# Patient Record
Sex: Male | Born: 1980 | Race: Black or African American | Hispanic: No | Marital: Single | Smoking: Current some day smoker
Health system: Southern US, Community
[De-identification: ages and names within clinical notes are randomized; demographics above are authoritative.]

## PROBLEM LIST (undated history)

## (undated) ENCOUNTER — Emergency Department (HOSPITAL_BASED_OUTPATIENT_CLINIC_OR_DEPARTMENT_OTHER): Payer: 59 | Source: Home / Self Care

## (undated) DIAGNOSIS — I10 Essential (primary) hypertension: Secondary | ICD-10-CM

## (undated) DIAGNOSIS — G8929 Other chronic pain: Secondary | ICD-10-CM

## (undated) DIAGNOSIS — M549 Dorsalgia, unspecified: Secondary | ICD-10-CM

## (undated) HISTORY — PX: CHOLECYSTECTOMY: SHX55

---

## 2004-09-24 ENCOUNTER — Emergency Department (HOSPITAL_COMMUNITY): Admission: EM | Admit: 2004-09-24 | Discharge: 2004-09-24 | Payer: Self-pay | Admitting: Emergency Medicine

## 2005-12-21 ENCOUNTER — Emergency Department (HOSPITAL_COMMUNITY): Admission: EM | Admit: 2005-12-21 | Discharge: 2005-12-21 | Payer: Self-pay | Admitting: Emergency Medicine

## 2005-12-23 ENCOUNTER — Emergency Department (HOSPITAL_COMMUNITY): Admission: EM | Admit: 2005-12-23 | Discharge: 2005-12-23 | Payer: Self-pay | Admitting: Emergency Medicine

## 2005-12-25 ENCOUNTER — Emergency Department (HOSPITAL_COMMUNITY): Admission: EM | Admit: 2005-12-25 | Discharge: 2005-12-25 | Payer: Self-pay | Admitting: Emergency Medicine

## 2006-04-01 ENCOUNTER — Emergency Department (HOSPITAL_COMMUNITY): Admission: EM | Admit: 2006-04-01 | Discharge: 2006-04-01 | Payer: Self-pay | Admitting: Emergency Medicine

## 2006-10-09 ENCOUNTER — Emergency Department (HOSPITAL_COMMUNITY): Admission: EM | Admit: 2006-10-09 | Discharge: 2006-10-09 | Payer: Self-pay | Admitting: Emergency Medicine

## 2008-05-05 ENCOUNTER — Emergency Department (HOSPITAL_BASED_OUTPATIENT_CLINIC_OR_DEPARTMENT_OTHER): Admission: EM | Admit: 2008-05-05 | Discharge: 2008-05-05 | Payer: Self-pay | Admitting: Emergency Medicine

## 2008-05-25 ENCOUNTER — Ambulatory Visit: Payer: Self-pay | Admitting: Diagnostic Radiology

## 2008-05-25 ENCOUNTER — Emergency Department (HOSPITAL_BASED_OUTPATIENT_CLINIC_OR_DEPARTMENT_OTHER): Admission: EM | Admit: 2008-05-25 | Discharge: 2008-05-25 | Payer: Self-pay | Admitting: Emergency Medicine

## 2009-08-03 ENCOUNTER — Emergency Department (HOSPITAL_BASED_OUTPATIENT_CLINIC_OR_DEPARTMENT_OTHER): Admission: EM | Admit: 2009-08-03 | Discharge: 2009-08-03 | Payer: Self-pay | Admitting: Emergency Medicine

## 2009-08-17 ENCOUNTER — Ambulatory Visit: Payer: Self-pay | Admitting: Internal Medicine

## 2009-08-17 DIAGNOSIS — I10 Essential (primary) hypertension: Secondary | ICD-10-CM | POA: Insufficient documentation

## 2009-08-17 DIAGNOSIS — M545 Low back pain: Secondary | ICD-10-CM

## 2009-08-21 ENCOUNTER — Ambulatory Visit: Payer: Self-pay | Admitting: Internal Medicine

## 2009-08-21 LAB — CONVERTED CEMR LAB
Chloride: 107 meq/L (ref 96–112)
HDL: 24 mg/dL — ABNORMAL LOW (ref >39)
LDL Cholesterol: 68 mg/dL (ref 0–99)
Potassium: 3.5 meq/L (ref 3.5–5.3)
Triglycerides: 54 mg/dL (ref <150)
VLDL: 11 mg/dL (ref 0–40)

## 2009-08-22 ENCOUNTER — Encounter: Payer: Self-pay | Admitting: Internal Medicine

## 2009-11-17 ENCOUNTER — Ambulatory Visit: Payer: Self-pay | Admitting: Internal Medicine

## 2009-11-17 DIAGNOSIS — K648 Other hemorrhoids: Secondary | ICD-10-CM

## 2009-11-26 ENCOUNTER — Ambulatory Visit: Payer: Self-pay | Admitting: Internal Medicine

## 2010-06-03 ENCOUNTER — Emergency Department (HOSPITAL_BASED_OUTPATIENT_CLINIC_OR_DEPARTMENT_OTHER)
Admission: EM | Admit: 2010-06-03 | Discharge: 2010-06-03 | Payer: Self-pay | Source: Home / Self Care | Admitting: Emergency Medicine

## 2010-07-06 NOTE — Letter (Signed)
Summary: Work Dietitian at [employer]. [address]   [place], [place] [address]   Phone: [phone]  Fax: [phone]      Today's Date: August 17, 2009    Name of Patient: Derrick Holden  The above named patient had a medical visit today .Please take this into consideration when reviewing the time away from work.  Special Instructions:  Arly.Keller ] None  [  ] To be off the remainder of today, returning to the normal work / school schedule tomorrow.  [  ] To be off until the next scheduled appointment on ______________________.  [  ] Other ________________________________________________________________ ________________________________________________________________________     Sincerely yours,   Glendell Docker CMA Dr Thomos Lemons

## 2010-07-06 NOTE — Letter (Signed)
   [place] at [facility] [address] [place], [place]  [address]  [place] Phone: [phone]      August 24, 2009   KIE CALVIN [address] [place], [place] [address]  RE:  LAB RESULTS  Dear  Mr. KELLEHER,  The following is an interpretation of your most recent lab tests.  Please take note of any instructions provided or changes to medications that have resulted from your lab work.  ELECTROLYTES:  Good - no changes needed  LIPID PANEL:  Good - no changes needed Triglyceride: 54   Cholesterol: 103   LDL: 68   HDL: 24   Chol/HDL%:  4.3 Ratio  THYROID STUDIES:  Thyroid studies normal TSH: 2.081       Sincerely Yours,    Dr. Thomos Lemons

## 2010-07-06 NOTE — Assessment & Plan Note (Signed)
Summary: 3 month follow up/mhf--Rm 3   Vital Signs:  Patient profile:   30 year old male Height:      75 inches Weight:      285 pounds BMI:     35.75 Temp:     97.8 degrees F oral Pulse rate:   66 / minute Pulse rhythm:   regular Resp:     16 per minute BP sitting:   148 / 90  (right arm) Cuff size:   large  Vitals Entered By: Mervin Kung CMA (November 17, 2009 2:10 PM) CC: Room 3  3 month follow up. Is Patient Diabetic? No   Primary Care Provider:  Dondra Spry DO  CC:  Room 3  3 month follow up.Marland Kitchen  History of Present Illness:       This is a 30 year old male who presents with hemorrhoids.  The patient complains of blood in stool, blood streaked stool, and straining, but denies abdominal pain.  The symptoms are worse with straining at stool and constipation.    elevated bp - never treated.  no chest pain .  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   Head:  normocephalic and atraumatic.   Ears:  R ear normal and L ear normal.   Mouth:  pharynx pink and moist.   Neck:  No deformities, masses, or tenderness noted. Lungs:  normal respiratory effort and normal breath sounds.   Heart:  normal rate, regular rhythm, and no gallop.   Abdomen:  soft, non-tender, and no masses.   Rectal:  normal sphincter tone, no masses, and internal hemorrhoid(s).   Extremities:  trace left pedal edema and trace right pedal edema.   Neurologic:  cranial nerves II-XII intact and gait normal.   Psych:  normally interactive, good eye contact, not anxious appearing, and not depressed appearing.       Impression & Recommendations:  Problem # 1:  HYPERTENSION (ICD-401.9) start losartan.  encourage low salt diet   His updated medication list for this problem includes:    Losartan Potassium 50 Mg Tabs (Losartan potassium) ..... One by mouth once daily  BP today: 148/90 Prior BP: 130/80 (08/17/2009)  Labs Reviewed: K+: 3.5 (08/21/2009) Creat: : 1.12 (08/21/2009)   Chol: 103 (08/21/2009)   HDL: 24 (08/21/2009)   LDL: 68 (08/21/2009)   TG: 54 (08/21/2009)  Problem # 2:  HEMORRHOIDS, INTERNAL, WITH BLEEDING (ICD-455.2) use stool softener and anamantle.  if persistent symptoms, refer to surgery  Complete Medication List: 1)  Losartan Potassium 50 Mg Tabs (Losartan potassium) .... One by mouth once daily 2)  Docusate Sodium 100 Mg Caps (Docusate sodium) .... One by mouth two times a day  Patient Instructions: 1)  Please schedule a follow-up appointment in 1 month. 2)  BMP prior to visit, ICD-9: 401.9 3)  Please return for lab work one (1) week before your next appointment.  Prescriptions: DOCUSATE SODIUM 100 MG CAPS (DOCUSATE SODIUM) one by mouth two times a day  #60 x 0   Entered and Authorized by:   D. Thomos Lemons DO   Signed by:   D. Thomos Lemons  DO on 11/17/2009   Method used:   Electronically to        [facility] # [phone]* (retail)       [address]       [place], [place]  [address]       Ph: [phone]       Fax: [phone]   RxID:   [id number] LIDOCAINE-HYDROCORTISONE ACE 3-1 % KIT (LIDOCAINE-HYDROCORTISONE ACE) use one applicatorful two times a day  #1 week x 0   Entered and Authorized by:   D. Thomos Lemons DO   Signed by:   D. Thomos Lemons DO on 11/17/2009   Method used:   Electronically to        [facility] # [phone]* (retail)       [address]       [place], [place]  [address]       Ph: [phone]       Fax: [phone]   RxID:   [id number] LOSARTAN POTASSIUM 50 MG TABS (LOSARTAN POTASSIUM)  one by mouth once daily  #30 x 2   Entered and Authorized by:   D. Thomos Lemons DO   Signed by:   D. Thomos Lemons DO on 11/17/2009   Method used:   Electronically to        [facility] # [phone]* (retail)       [address] [place] [address]       [place], [place]  [address]       Ph: [phone]       Fax: [phone]   RxID:   [id number]   Current Allergies (reviewed today): No known allergies

## 2010-07-06 NOTE — Assessment & Plan Note (Signed)
Summary: NEW TO EST/MHF   Vital Signs:  Patient profile:   30 year old male Height:      75 inches Weight:      299.50 pounds BMI:     37.57 O2 Sat:      100 % Temp:     97.8 degrees F oral Pulse rate:   86 / minute Pulse rhythm:   regular Resp:     18 per minute BP sitting:   130 / 80  (right arm) Cuff size:   large  Vitals Entered By: Glendell Docker CMA (August 17, 2009 1:24 PM) CC: Rm 2- New Patient -Establish Care   Primary Care Provider:  Dondra Spry DO  CC:  Rm 2- New Patient -Establish Care.  History of Present Illness: 20 AA male to establish  He was seen in ER for low back pain 1-2 wks ago - noticed BP was high SBP 170/ 80 - mild headaches.  no chest pain .  no SOB  Hx of chronic low back pain.  after prolonged sitting - back feels stiff on and off x 3 yrs.    more on right side  slipped on ice but  caught himself from falling  3 yrsago no injury or trauma played football in college (Defensive end) describes as sharp no radiation no uninary complaints  no weakness  Preventive Screening-Counseling & Management  Alcohol-Tobacco     Alcohol drinks/day: 2-3 beers monthly     Smoking Status: never  Caffeine-Diet-Exercise     Caffeine use/day: 4 beverages daily     Does Patient Exercise: no  Allergies (verified): No Known Drug Allergies  Past History:  Past Medical History: Hypertension  Past Surgical History: Cholecystectomy  2009  Family History: Family History Diabetes 1st degree relative - father Family History Hypertension  Social History: 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  The patient denies chest pain, dyspnea on exertion, prolonged cough, abdominal pain, melena, hematochezia, severe indigestion/heartburn, and depression.     Physical Exam  General:  alert and overweight-appearing.   Head:  normocephalic and atraumatic.   Eyes:  pupils equal, pupils round, and pupils reactive to light.   Ears:  R ear normal and L ear normal.   Mouth:  Oral mucosa and oropharynx without lesions or exudates.  Teeth in good repair. Neck:  No deformities, masses, or tenderness noted.no carotid bruits.   Lungs:  normal respiratory effort, normal breath sounds, no crackles, and no wheezes.   Heart:  normal rate, regular rhythm, no murmur, and no gallop.   Abdomen:  soft, non-tender, normal bowel sounds, no masses, no hepatomegaly, and no splenomegaly.   Extremities:  trace left pedal edema and trace right pedal edema.   Neurologic:  cranial nerves II-XII intact and gait normal.   Psych:  normally interactive, good eye contact, not anxious appearing, and not depressed appearing.     Impression & Recommendations:  Problem # 1:  ELEVATED BLOOD PRESSURE WITHOUT DIAGNOSIS OF HYPERTENSION (ICD-796.2) Pt seen in ER for LBP.  SBP noted 160-170.  BP today is high normal.  Keep BP log at home  BP today: 130/80  Instructed in low sodium diet  and behavior modification.    Problem # 2:  BACK PAIN, LUMBAR, CHRONIC (ICD-724.2) Pt with  chronic back stiffness.  no sign or symptom of radiculopathy.  he has appt at [facility].   I suspect back pain from spondylosis.  use OTC ibuprofen as needed.  Pt counseled on wt loss.  Problem # 3:  FAMILY HISTORY DIABETES 1ST DEGREE RELATIVE (ICD-V18.0) Pt counceled on diet and exercise.  screen for DM II - A1c  Patient Instructions: 1)  Please schedule a follow-up appointment in 3 months. 2)  [email or web address] 3)  Limit your calories to 2400-2500 calories per day 4)  BMP prior to visit, ICD-9:  401.9 5)  Lipid Panel prior to visit, ICD-9: 401.9 6)  TSH prior to visit, ICD-9:  401.9 7)  HbgA1C prior to visit, ICD-9:   v18.0 8)  Limit your Sodium (Salt) to less than 2.5 grams a  day(slightly less than 1/2 a teaspoon) to prevent fluid retention, swelling, or worsening of symptoms.  Current Allergies (reviewed today): No known allergies /   Immunization History:  Influenza Immunization History:    Influenza:  historical (03/19/2009)

## 2011-02-22 ENCOUNTER — Encounter: Payer: Self-pay | Admitting: *Deleted

## 2011-02-22 ENCOUNTER — Emergency Department (HOSPITAL_BASED_OUTPATIENT_CLINIC_OR_DEPARTMENT_OTHER)
Admission: EM | Admit: 2011-02-22 | Discharge: 2011-02-22 | Disposition: A | Discharge status: Home / Self Care | Payer: Self-pay | Attending: Emergency Medicine | Admitting: Emergency Medicine

## 2011-02-22 DIAGNOSIS — I10 Essential (primary) hypertension: Secondary | ICD-10-CM

## 2011-02-22 DIAGNOSIS — R319 Hematuria, unspecified: Secondary | ICD-10-CM | POA: Insufficient documentation

## 2011-02-22 DIAGNOSIS — M549 Dorsalgia, unspecified: Secondary | ICD-10-CM | POA: Insufficient documentation

## 2011-02-22 LAB — URINALYSIS, ROUTINE W REFLEX MICROSCOPIC
Bilirubin Urine: NEGATIVE
Glucose, UA: NEGATIVE mg/dL
Ketones, ur: NEGATIVE mg/dL
Protein, ur: NEGATIVE mg/dL

## 2011-02-22 MED ORDER — OXYCODONE-ACETAMINOPHEN 5-325 MG PO TABS: 1.0000 | ORAL_TABLET | Freq: Four times a day (QID) | ORAL | Status: AC | PRN

## 2011-02-22 MED ORDER — ONDANSETRON 8 MG PO TBDP
8.0000 mg | ORAL_TABLET | Freq: Once | ORAL | Status: AC
  Administered 2011-02-22: 8 mg via ORAL
  Filled 2011-02-22: qty 1

## 2011-02-22 MED ORDER — METRONIDAZOLE 500 MG PO TABS
2000.0000 mg | ORAL_TABLET | Freq: Once | ORAL | Status: AC
  Administered 2011-02-22: 2000 mg via ORAL
  Filled 2011-02-22: qty 4

## 2011-02-22 NOTE — ED Notes (Signed)
Lower back pain x 2 weeks. Thinks he has an STD.

## 2011-02-22 NOTE — ED Provider Notes (Signed)
History     CSN: [id number] Arrival date & time: 02/22/2011  5:48 PM Pt seen at 1800  Chief Complaint  Patient presents with  . Back Pain     (Include location/radiation/quality/duration/timing/severity/associated sxs/prior treatment) Patient is a 30 y.o. male presenting with back pain. The history is provided by the patient.  Back Pain  This is a new problem. The current episode started more than 1 week ago. The problem occurs constantly. The problem has been gradually worsening. The pain is associated with falling. The pain is present in the lumbar spine. The pain is moderate. The symptoms are aggravated by twisting and certain positions. Pertinent negatives include no chest pain, no fever, no numbness, no abdominal pain, no bowel incontinence, no bladder incontinence, no dysuria, no leg pain, no tingling and no weakness.   Pt reports he slipped at work but he caught himself and did not fall directly on back Since he has had pain No dysuria No low abd pain No dysuria/penile discharge Reports his girlfriend told him she tested positive for trichomonas, he is requesting treatment/testing  History reviewed. No pertinent past medical history.   Past Surgical History  Procedure Date  . Cholecystectomy     History reviewed. No pertinent family history.  History  Substance Use Topics  . Smoking status: Current Some Day Smoker  . Smokeless tobacco: Not on file  . Alcohol Use: Yes      Review of Systems  Constitutional: Negative for fever.  Cardiovascular: Negative for chest pain.  Gastrointestinal: Negative for abdominal pain and bowel incontinence.  Genitourinary: Negative for bladder incontinence and dysuria.  Musculoskeletal: Positive for back pain.  Neurological: Negative for tingling, weakness and numbness.  All other systems reviewed and are negative.    Allergies  Review of patient's allergies indicates no known allergies.  Home Medications   Current  Outpatient Rx  Name Route Sig Dispense Refill  . ACETAMINOPHEN 500 MG PO CHEW Oral Chew 500 mg by mouth every 6 (six) hours as needed. pain     . NAPROXEN SODIUM 220 MG PO TABS Oral Take 220 mg by mouth 2 (two) times daily with a meal. pain     . OXYCODONE-ACETAMINOPHEN 5-325 MG PO TABS Oral Take 1 tablet by mouth every 6 (six) hours as needed for pain. 10 tablet 0    Physical Exam    BP 151/100  Pulse 87  Temp(Src) 98.5 F (36.9 C) (Oral)  Resp 20  SpO2 100%  Physical Exam  CONSTITUTIONAL: Well developed/well nourished HEAD AND FACE: Normocephalic/atraumatic EYES: EOMI/PERRL ENMT: Mucous membranes moist NECK: supple no meningeal signs SPINE:entire spine nontender, tender in right paraspinal region, no erythema/bruising noted CV: S1/S2 noted, no murmurs/rubs/gallops noted LUNGS: Lungs are clear to auscultation bilaterally, no apparent distress ABDOMEN: soft, nontender, no rebound or guarding GU:no cva tenderness Chaperone present - no penile disharge, normal external genitalia NEURO: Pt is awake/alert, moves all extremitiesx4 Gait normal No focal motor deficits noted in the LE EXTREMITIES: pulses normal, full ROM SKIN: warm, color normal PSYCH: no abnormalities of mood noted   ED Course  Procedures  Results for orders placed during the hospital encounter of 02/22/11  URINALYSIS, ROUTINE W REFLEX MICROSCOPIC      Component Value Range   Color, Urine YELLOW  YELLOW    Appearance CLEAR  CLEAR    Specific Gravity, Urine 1.028  1.005 - 1.030    pH 6.5  5.0 - 8.0    Glucose, UA NEGATIVE  NEGATIVE (mg/dL)  Hgb urine dipstick LARGE (*) NEGATIVE    Bilirubin Urine NEGATIVE  NEGATIVE    Ketones, ur NEGATIVE  NEGATIVE (mg/dL)   Protein, ur NEGATIVE  NEGATIVE (mg/dL)   Urobilinogen, UA 1.0  0.0 - 1.0 (mg/dL)   Nitrite NEGATIVE  NEGATIVE    Leukocytes, UA NEGATIVE  NEGATIVE   URINE MICROSCOPIC-ADD ON      Component Value Range   Squamous Epithelial / LPF RARE  RARE     RBC / HPF 11-20  <3 (RBC/hpf)   Bacteria, UA RARE  RARE      1. Back pain   2. HYPERTENSION   3. Hematuria      MDM Nursing notes reviewed and considered in documentation All labs/vitals reviewed and considered - hematuria, hypertension  Advised f/u with occupational health as back pain could be work related Also referred to urology for hematuria Also advised to f/u on HTN, he reports he is supposed to be on meds but stopped taking them Urine sent for culture        Joya Gaskins, MD 02/22/11 1925

## 2011-02-23 LAB — GC/CHLAMYDIA PROBE AMP, GENITAL
Chlamydia, DNA Probe: NEGATIVE
GC Probe Amp, Genital: NEGATIVE

## 2011-02-24 LAB — URINE CULTURE: Culture  Setup Time: 201209190628

## 2011-03-11 LAB — COMPREHENSIVE METABOLIC PANEL
Albumin: 4.6 g/dL (ref 3.5–5.2)
BUN: 9 mg/dL (ref 6–23)
Chloride: 104 mEq/L (ref 96–112)
Creatinine, Ser: 1 mg/dL (ref 0.4–1.5)
GFR calc non Af Amer: >60 mL/min (ref >60)
Total Bilirubin: 0.4 mg/dL (ref 0.3–1.2)

## 2011-03-11 LAB — DIFFERENTIAL
Basophils Absolute: 0 10*3/uL (ref 0.0–0.1)
Lymphocytes Relative: 7 % — ABNORMAL LOW (ref 12–46)
Monocytes Absolute: 0.4 10*3/uL (ref 0.1–1.0)
Neutro Abs: 9.5 10*3/uL — ABNORMAL HIGH (ref 1.7–7.7)
Neutrophils Relative %: 89 % — ABNORMAL HIGH (ref 43–77)

## 2011-03-11 LAB — CBC
HCT: 40.7 % (ref 39.0–52.0)
MCV: 87.3 fL (ref 78.0–100.0)
Platelets: 280 10*3/uL (ref 150–400)
RDW: 13.3 % (ref 11.5–15.5)
WBC: 10.6 10*3/uL — ABNORMAL HIGH (ref 4.0–10.5)

## 2011-03-11 LAB — LIPASE, BLOOD: Lipase: 107 U/L (ref 23–300)

## 2011-08-24 ENCOUNTER — Emergency Department (INDEPENDENT_AMBULATORY_CARE_PROVIDER_SITE_OTHER): Payer: Self-pay

## 2011-08-24 ENCOUNTER — Encounter (HOSPITAL_BASED_OUTPATIENT_CLINIC_OR_DEPARTMENT_OTHER): Payer: Self-pay | Admitting: Student

## 2011-08-24 ENCOUNTER — Emergency Department (HOSPITAL_BASED_OUTPATIENT_CLINIC_OR_DEPARTMENT_OTHER)
Admission: EM | Admit: 2011-08-24 | Discharge: 2011-08-24 | Disposition: A | Discharge status: Home / Self Care | Payer: Self-pay | Attending: Emergency Medicine | Admitting: Emergency Medicine

## 2011-08-24 DIAGNOSIS — IMO0002 Reserved for concepts with insufficient information to code with codable children: Secondary | ICD-10-CM | POA: Insufficient documentation

## 2011-08-24 DIAGNOSIS — M25519 Pain in unspecified shoulder: Secondary | ICD-10-CM | POA: Insufficient documentation

## 2011-08-24 DIAGNOSIS — I10 Essential (primary) hypertension: Secondary | ICD-10-CM | POA: Insufficient documentation

## 2011-08-24 DIAGNOSIS — S46919A Strain of unspecified muscle, fascia and tendon at shoulder and upper arm level, unspecified arm, initial encounter: Secondary | ICD-10-CM

## 2011-08-24 DIAGNOSIS — F17200 Nicotine dependence, unspecified, uncomplicated: Secondary | ICD-10-CM | POA: Insufficient documentation

## 2011-08-24 DIAGNOSIS — X500XXA Overexertion from strenuous movement or load, initial encounter: Secondary | ICD-10-CM | POA: Insufficient documentation

## 2011-08-24 MED ORDER — TRAMADOL HCL 50 MG PO TABS: 50.0000 mg | ORAL_TABLET | Freq: Four times a day (QID) | ORAL | Status: AC | PRN

## 2011-08-24 NOTE — ED Provider Notes (Signed)
History     CSN: [id number]  Arrival date & time 08/24/11  1709   First MD Initiated Contact with Patient 08/24/11 1741      Chief Complaint  Patient presents with  . Shoulder Pain    left    (Consider location/radiation/quality/duration/timing/severity/associated sxs/prior treatment) HPI Comments: Pt states that he woke up with the pain Sunday morning after heavy lifting on Saturday:pt works for [employer] and does a lot or repetitive motion and heavy lifting:pt states that it hurts to move the arm above the shoulder  Patient is a 31 y.o. male presenting with shoulder pain. The history is provided by the patient. No language interpreter was used.  Shoulder Pain This is a new problem. The current episode started in the past 7 days. The problem occurs constantly. The problem has been unchanged. Pertinent negatives include no nausea or weakness. The symptoms are aggravated by bending. He has tried nothing for the symptoms.    History reviewed. No pertinent past medical history.  Past Surgical History  Procedure Date  . Cholecystectomy     History reviewed. No pertinent family history.  History  Substance Use Topics  . Smoking status: Current Some Day Smoker  . Smokeless tobacco: Not on file  . Alcohol Use: Yes      Review of Systems  Gastrointestinal: Negative for nausea.  Neurological: Negative for weakness.  All other systems reviewed and are negative.    Allergies  Review of patient's allergies indicates no known allergies.  Home Medications  No current outpatient prescriptions on file.  BP 183/106  Pulse 88  Temp(Src) 98 F (36.7 C) (Oral)  Resp 20  Ht 6\' 3"  (1.905 m)  Wt 285 lb (129.275 kg)  BMI 35.62 kg/m2  SpO2 100%  Physical Exam  Nursing note and vitals reviewed. Constitutional: He is oriented to person, place, and time. He appears well-developed and well-nourished.  Cardiovascular: Normal rate and regular rhythm.   Pulmonary/Chest: Effort normal  and breath sounds normal.  Musculoskeletal:       Pt tender in the anterior left shoulder:pt able to posteriorly rotate:pt has full rom with lifting up but pain starts at shoulder height:pulses intact  Neurological: He is alert and oriented to person, place, and time.  Skin: Skin is warm and dry.  Psychiatric: He has a normal mood and affect.    ED Course  Procedures (including critical care time)  Labs Reviewed - No data to display Dg Shoulder Left  08/24/2011  *RADIOLOGY REPORT*  Clinical Data: Severe left shoulder pain, does heavy lifting at work  LEFT SHOULDER - 2+ VIEW  Comparison: None  Findings: AC joint alignment normal. Osseous mineralization normal. No acute fracture, dislocation, or bone destruction. Visualized left ribs intact.  IMPRESSION: No acute abnormalities.  Original Report Authenticated By: Lollie Marrow, M.D.     1. Hypertension   2. Shoulder strain       MDM  No acute injury noted:likely strain from repetitive motion:will treat symptomatically        Teressa Lower, NP 08/24/11 [phone]

## 2011-08-24 NOTE — ED Notes (Signed)
Pt in with c/o left shoulder pain and decreased movement s/p waking up Sunday morning after working Saturday lifting heavy boxes.

## 2011-08-24 NOTE — Discharge Instructions (Signed)
Joint Sprain A sprain is a tear or stretch in the ligaments that hold a joint together. Severe sprains may need as long as 3-6 weeks of immobilization and/or exercises to heal completely. Sprained joints should be rested and protected. If not, they can become unstable and prone to re-injury. Proper treatment can reduce your pain, shorten the period of disability, and reduce the risk of repeated injuries. TREATMENT   Rest and elevate the injured joint to reduce pain and swelling.   Apply ice packs to the injury for 20-30 minutes every 2-3 hours for the next 2-3 days.   Keep the injury wrapped in a compression bandage or splint as long as the joint is painful or as instructed by your caregiver.   Do not use the injured joint until it is completely healed to prevent re-injury and chronic instability. Follow the instructions of your caregiver.   Long-term sprain management may require exercises and/or treatment by a physical therapist. Taping or special braces may help stabilize the joint until it is completely better.  SEEK MEDICAL CARE IF:   You develop increased pain or swelling of the joint.   You develop increasing redness and warmth of the joint.   You develop a fever.   It becomes stiff.   Your hand or foot gets cold or numb.  Document Released: 06/30/2004 Document Revised: 05/12/2011 Document Reviewed: 06/09/2008 [facility] Patient Information 2012 [place], [place].Muscle Strain A muscle strain, or pulled muscle, occurs when a muscle is over-stretched. A small number of muscle fibers may also be torn. This is especially common in athletes. This happens when a sudden violent force placed on a muscle pushes it past its capacity. Usually, recovery from a pulled muscle takes 1 to 2 weeks. But complete healing will take 5 to 6 weeks. There are millions of muscle fibers. Following injury, your body will usually return to normal quickly. HOME CARE INSTRUCTIONS   While awake, apply ice to the  sore muscle for 15 to 20 minutes each hour for the first 2 days. Put ice in a plastic bag and place a towel between the bag of ice and your skin.   Do not use the pulled muscle for several days. Do not use the muscle if you have pain.   You may wrap the injured area with an elastic bandage for comfort. Be careful not to bind it too tightly. This may interfere with blood circulation.   Only take over-the-counter or prescription medicines for pain, discomfort, or fever as directed by your caregiver. Do not use aspirin as this will increase bleeding (bruising) at injury site.   Warming up before exercise helps prevent muscle strains.  SEEK MEDICAL CARE IF:  There is increased pain or swelling in the affected area. MAKE SURE YOU:   Understand these instructions.   Will watch your condition.   Will get help right away if you are not doing well or get worse.  Document Released: 05/23/2005 Document Revised: 05/12/2011 Document Reviewed: 12/20/2006 [facility] Patient Information 2012 [employer], [place].

## 2011-08-25 NOTE — ED Provider Notes (Signed)
Medical screening examination/treatment/procedure(s) were performed by non-physician practitioner and as supervising physician I was immediately available for consultation/collaboration.   Gwyneth Sprout, MD 08/25/11 [phone]

## 2012-04-16 ENCOUNTER — Encounter (HOSPITAL_BASED_OUTPATIENT_CLINIC_OR_DEPARTMENT_OTHER): Payer: Self-pay | Admitting: Emergency Medicine

## 2012-04-16 DIAGNOSIS — R05 Cough: Secondary | ICD-10-CM | POA: Insufficient documentation

## 2012-04-16 DIAGNOSIS — IMO0002 Reserved for concepts with insufficient information to code with codable children: Secondary | ICD-10-CM | POA: Insufficient documentation

## 2012-04-16 DIAGNOSIS — R6889 Other general symptoms and signs: Secondary | ICD-10-CM | POA: Insufficient documentation

## 2012-04-16 DIAGNOSIS — F17200 Nicotine dependence, unspecified, uncomplicated: Secondary | ICD-10-CM | POA: Insufficient documentation

## 2012-04-16 DIAGNOSIS — Y929 Unspecified place or not applicable: Secondary | ICD-10-CM | POA: Insufficient documentation

## 2012-04-16 DIAGNOSIS — R059 Cough, unspecified: Secondary | ICD-10-CM | POA: Insufficient documentation

## 2012-04-16 DIAGNOSIS — R1310 Dysphagia, unspecified: Secondary | ICD-10-CM | POA: Insufficient documentation

## 2012-04-16 DIAGNOSIS — Y9389 Activity, other specified: Secondary | ICD-10-CM | POA: Insufficient documentation

## 2012-04-16 DIAGNOSIS — R070 Pain in throat: Secondary | ICD-10-CM | POA: Insufficient documentation

## 2012-04-16 NOTE — ED Notes (Signed)
Pt c/o right sided throat discomfort. Pt concerned he may have a fish bone stuck in throat

## 2012-04-16 NOTE — ED Notes (Signed)
Pt able to swallow and handle secretions. Pt states pain in throat worse when laying down.

## 2012-04-17 ENCOUNTER — Emergency Department (HOSPITAL_BASED_OUTPATIENT_CLINIC_OR_DEPARTMENT_OTHER): Payer: Self-pay

## 2012-04-17 ENCOUNTER — Emergency Department (HOSPITAL_BASED_OUTPATIENT_CLINIC_OR_DEPARTMENT_OTHER)
Admission: EM | Admit: 2012-04-17 | Discharge: 2012-04-17 | Disposition: A | Discharge status: Home / Self Care | Payer: Self-pay | Attending: Emergency Medicine | Admitting: Emergency Medicine

## 2012-04-17 DIAGNOSIS — R070 Pain in throat: Secondary | ICD-10-CM

## 2012-04-17 DIAGNOSIS — K228 Other specified diseases of esophagus: Secondary | ICD-10-CM

## 2012-04-17 NOTE — ED Provider Notes (Signed)
History     CSN: [id number]  Arrival date & time 04/16/12  2304   First MD Initiated Contact with Patient 04/17/12 0115      Chief Complaint  Patient presents with  . Foreign Body    (Consider location/radiation/quality/duration/timing/severity/associated sxs/prior treatment) HPI Comments: Derrick Holden presents ambulatory for evaluation of a possible esophageal foreign body.  He states he returned home after eating dinner (boneless tilapia).  He felt a scratchy sensation in his throat as if there was something stuck.  He reports that he did some gagging but the sensation persisted.  He drank water and then ate some bread but continues to have that feeling.  He denies fever, SOB, nausea, vomiting, diarrhea, or throat swelling.  He has a never had chest or esophageal surgery or radiation.  Patient is a 31 y.o. male presenting with foreign body. The history is provided by the patient. No language interpreter was used.  Foreign Body  The current episode started 3 to 5 hours ago. The foreign body is suspected to be swallowed. The foreign body is food. Associated symptoms include sore throat, trouble swallowing and cough. Pertinent negatives include no chest pain, no fever, no vomiting, no congestion, no drainage, no drooling, no nosebleeds, no choking and no difficulty breathing. He has been behaving normally. His past medical history does not include prior foreign body removal, esophageal disease or pica. There were no sick contacts. He has received no recent medical care.    History reviewed. No pertinent past medical history.  Past Surgical History  Procedure Date  . Cholecystectomy     No family history on file.  History  Substance Use Topics  . Smoking status: Current Some Day Smoker  . Smokeless tobacco: Not on file  . Alcohol Use: Yes      Review of Systems  Constitutional: Negative for fever.  HENT: Positive for sore throat and trouble swallowing. Negative for  nosebleeds, congestion and drooling.   Respiratory: Positive for cough. Negative for choking.   Cardiovascular: Negative for chest pain.  Gastrointestinal: Negative for vomiting.  All other systems reviewed and are negative.    Allergies  Review of patient's allergies indicates no known allergies.  Home Medications  No current outpatient prescriptions on file.  BP 162/111  Pulse 91  Temp 98 F (36.7 C) (Oral)  Resp 18  Ht 6\' 4"  (1.93 m)  Wt 304 lb (137.893 kg)  BMI 37.00 kg/m2  SpO2 100%  Physical Exam  Nursing note and vitals reviewed. Constitutional: He is oriented to person, place, and time. He appears well-developed and well-nourished.  Non-toxic appearance. He does not have a sickly appearance. He does not appear ill. No distress.  HENT:  Head: Normocephalic and atraumatic. Head is without raccoon's eyes, without Battle's sign, without right periorbital erythema and without left periorbital erythema. No trismus in the jaw.  Right Ear: External ear normal.  Left Ear: External ear normal.  Nose: Nose normal.  Mouth/Throat: Uvula is midline, oropharynx is clear and moist and mucous membranes are normal. Mucous membranes are not pale, not dry and not cyanotic. He does not have dentures. No oral lesions. Normal dentition. No dental abscesses, uvula swelling, lacerations or dental caries. No oropharyngeal exudate, posterior oropharyngeal edema, posterior oropharyngeal erythema or tonsillar abscesses.  Eyes: Conjunctivae normal are normal. Pupils are equal, round, and reactive to light. Right eye exhibits no discharge. Left eye exhibits no discharge. No scleral icterus.  Neck: Normal range of motion and full passive range  of motion without pain. Neck supple. No JVD present. No spinous process tenderness and no muscular tenderness present. Carotid bruit is not present. No rigidity. No tracheal deviation and no edema present. No mass and no thyromegaly present.  Cardiovascular: Normal  rate, regular rhythm, normal heart sounds and intact distal pulses.  Exam reveals no gallop and no friction rub.   No murmur heard. Pulmonary/Chest: Effort normal and breath sounds normal. No stridor. No respiratory distress. He has no wheezes. He has no rales. He exhibits no tenderness.  Abdominal: Soft. Bowel sounds are normal. He exhibits no distension and no mass. There is no tenderness. There is no rebound and no guarding.  Musculoskeletal: He exhibits no edema and no tenderness.  Lymphadenopathy:    He has no cervical adenopathy.  Neurological: He is alert and oriented to person, place, and time.  Skin: Skin is warm and dry. No rash noted. He is not diaphoretic. No erythema. No pallor.  Psychiatric: He has a normal mood and affect. His behavior is normal.    ED Course  Procedures (including critical care time)  Labs Reviewed - No data to display No results found.   No diagnosis found.    MDM  Pt presents for evaluation of a sensation of something stuck in his throat.  He ate fish for dinner.  He appears comfortable, NAD.  He has no clinical evidence of airway compromise and has tolerated po fluids and some bread since the onset of s/s.  Will obtain soft tissue xrays of the neck looking for a foreign body.  If necessary will move to [facility] for evaluation by ENT or GI (endoscopy).  0400.  Pt appears well, NAD.  He reports some improvement in the FB sensation without any particular intervention.  The soft tissue x-ray dose not demonstrate any foreign body or acute pathology.  He has no evidence of airway compromise.  At this time he will remain NPO and be discharged home.  Will arrange follow-up with ENT (or GI) this morning as an office visit.  If this isn't possible, he will be instructed to report to the [facility] ER for a repeat evaluation with consultation for evaluation and endoscopy.  I discussed at length indications for immediate return to the ER.  Prior to him leaving I confirmed his  contact number.      Derrick Chad, MD 04/17/12 [phone]

## 2012-08-03 ENCOUNTER — Emergency Department (HOSPITAL_COMMUNITY)
Admission: EM | Admit: 2012-08-03 | Discharge: 2012-08-03 | Disposition: A | Discharge status: Home / Self Care | Payer: Self-pay | Attending: Emergency Medicine | Admitting: Emergency Medicine

## 2012-08-03 ENCOUNTER — Encounter (HOSPITAL_COMMUNITY): Payer: Self-pay | Admitting: Emergency Medicine

## 2012-08-03 DIAGNOSIS — R1013 Epigastric pain: Secondary | ICD-10-CM | POA: Insufficient documentation

## 2012-08-03 DIAGNOSIS — R110 Nausea: Secondary | ICD-10-CM | POA: Insufficient documentation

## 2012-08-03 DIAGNOSIS — Z79899 Other long term (current) drug therapy: Secondary | ICD-10-CM | POA: Insufficient documentation

## 2012-08-03 DIAGNOSIS — F17200 Nicotine dependence, unspecified, uncomplicated: Secondary | ICD-10-CM | POA: Insufficient documentation

## 2012-08-03 LAB — COMPREHENSIVE METABOLIC PANEL
ALT: 38 U/L (ref 0–53)
AST: 57 U/L — ABNORMAL HIGH (ref 0–37)
Alkaline Phosphatase: 117 U/L (ref 39–117)
CO2: 30 mEq/L (ref 19–32)
Chloride: 103 mEq/L (ref 96–112)
GFR calc Af Amer: >90 mL/min (ref >90)
GFR calc non Af Amer: >90 mL/min (ref >90)
Glucose, Bld: 89 mg/dL (ref 70–99)
Potassium: 3.5 mEq/L (ref 3.5–5.1)
Sodium: 142 mEq/L (ref 135–145)

## 2012-08-03 LAB — URINALYSIS, ROUTINE W REFLEX MICROSCOPIC
Bilirubin Urine: NEGATIVE
Glucose, UA: NEGATIVE mg/dL
Hgb urine dipstick: NEGATIVE
Protein, ur: NEGATIVE mg/dL
Urobilinogen, UA: 0.2 mg/dL (ref 0.0–1.0)

## 2012-08-03 LAB — CBC
Hemoglobin: 13.5 g/dL (ref 13.0–17.0)
RBC: 4.75 MIL/uL (ref 4.22–5.81)
WBC: 14.4 10*3/uL — ABNORMAL HIGH (ref 4.0–10.5)

## 2012-08-03 MED ORDER — RANITIDINE HCL 150 MG PO TABS: 150.0000 mg | ORAL_TABLET | Freq: Two times a day (BID) | ORAL | Status: DC

## 2012-08-03 MED ORDER — GI COCKTAIL ~~LOC~~
30.0000 mL | Freq: Once | ORAL | Status: AC
  Administered 2012-08-03: 30 mL via ORAL
  Filled 2012-08-03: qty 30

## 2012-08-03 MED ORDER — SODIUM CHLORIDE 0.9 % IV BOLUS (SEPSIS)
1000.0000 mL | Freq: Once | INTRAVENOUS | Status: AC
  Administered 2012-08-03: 1000 mL via INTRAVENOUS

## 2012-08-03 NOTE — ED Notes (Signed)
WUJ:[id number]<JY> Expected date:<BR> Expected time:<BR> Means of arrival:Ambulance<BR> Comments:<BR> [id number]

## 2012-08-03 NOTE — ED Notes (Signed)
Per EMS-pt c/o of upper abd pain that started ago. Pain 10/10. Also c/o of nausea no vomiting.

## 2012-08-03 NOTE — ED Provider Notes (Signed)
History     CSN: [id number]  Arrival date & time 08/03/12  1432   First MD Initiated Contact with Patient 08/03/12 1503      Chief Complaint  Patient presents with  . Abdominal Pain    (Consider location/radiation/quality/duration/timing/severity/associated sxs/prior treatment) HPI Pt presents with c/o epigastric pain.  Pt states pain began approx 1 hour prior to arrival.  It was described as feeling sharp and cramping.  No vomiting, no fever/chills.  He did have nausea.  Pain was 10/10 and constant.  No pain upon my evaluation.  He has had symptoms of reflux in the past and did eat fast food for late breakfast.  There are no other associated systemic symptoms, there are no other alleviating or modifying factors.   History reviewed. No pertinent past medical history.  Past Surgical History  Procedure Laterality Date  . Cholecystectomy      History reviewed. No pertinent family history.  History  Substance Use Topics  . Smoking status: Current Some Day Smoker  . Smokeless tobacco: Not on file  . Alcohol Use: Yes      Review of Systems ROS reviewed and all otherwise negative except for mentioned in HPI  Allergies  Review of patient's allergies indicates no known allergies.  Home Medications   Current Outpatient Rx  Name  Route  Sig  Dispense  Refill  . ranitidine (ZANTAC) 150 MG tablet   Oral   Take 1 tablet (150 mg total) by mouth 2 (two) times daily.   60 tablet   0     BP 159/85  Pulse 83  Temp(Src) 97.9 F (36.6 C) (Oral)  Resp 16  SpO2 98% Vitals reviewed Physical Exam Physical Examination: General appearance - alert, well appearing, and in no distress Mental status - alert, oriented to person, place, and time Eyes - no scleral icterus, no conjunctival injection Mouth - mucous membranes moist, pharynx normal without lesions Chest - clear to auscultation, no wheezes, rales or rhonchi, symmetric air entry Heart - normal rate, regular rhythm, normal  S1, S2, no murmurs, rubs, clicks or gallops Abdomen - soft, nontender, nondistended, no masses or organomegaly, nabs Extremities - peripheral pulses normal, no pedal edema, no clubbing or cyanosis Skin - normal coloration and turgor, no rashes  ED Course  Procedures (including critical care time)  Labs Reviewed  CBC - Abnormal; Notable for the following:    WBC 14.4 (*)    All other components within normal limits  COMPREHENSIVE METABOLIC PANEL - Abnormal; Notable for the following:    Total Protein 8.7 (*)    AST 57 (*)    All other components within normal limits  LIPASE, BLOOD  URINALYSIS, ROUTINE W REFLEX MICROSCOPIC   No results found.   1. Epigastric pain       MDM  Pt presenting with acute onset of epigastric pain which is resolved upon arrival to the ED.  No vomiting.  He is s/p cholecystectomy.  Doubt biliary colic, lipase is normal making pancreatitis less likely.  Suspect GERD/reflux.  Will start trial of zantac. Pt is tolerating po intake in the ED without difficulty.  No chest pain or SOB.  Discharged with strict return precautions.  Pt agreeable with plan.        Ethelda Chick, MD 08/03/12 [phone]

## 2012-08-03 NOTE — ED Notes (Signed)
Patient was given a ginger ale and tolerated it well.

## 2012-08-19 ENCOUNTER — Emergency Department (HOSPITAL_BASED_OUTPATIENT_CLINIC_OR_DEPARTMENT_OTHER)
Admission: EM | Admit: 2012-08-19 | Discharge: 2012-08-19 | Disposition: A | Discharge status: Home / Self Care | Payer: Self-pay | Attending: Emergency Medicine | Admitting: Emergency Medicine

## 2012-08-19 ENCOUNTER — Encounter (HOSPITAL_BASED_OUTPATIENT_CLINIC_OR_DEPARTMENT_OTHER): Payer: Self-pay | Admitting: *Deleted

## 2012-08-19 DIAGNOSIS — IMO0002 Reserved for concepts with insufficient information to code with codable children: Secondary | ICD-10-CM | POA: Insufficient documentation

## 2012-08-19 DIAGNOSIS — F17200 Nicotine dependence, unspecified, uncomplicated: Secondary | ICD-10-CM | POA: Insufficient documentation

## 2012-08-19 DIAGNOSIS — Y9389 Activity, other specified: Secondary | ICD-10-CM | POA: Insufficient documentation

## 2012-08-19 DIAGNOSIS — Y990 Civilian activity done for income or pay: Secondary | ICD-10-CM | POA: Insufficient documentation

## 2012-08-19 DIAGNOSIS — Y9289 Other specified places as the place of occurrence of the external cause: Secondary | ICD-10-CM | POA: Insufficient documentation

## 2012-08-19 DIAGNOSIS — X500XXA Overexertion from strenuous movement or load, initial encounter: Secondary | ICD-10-CM | POA: Insufficient documentation

## 2012-08-19 MED ORDER — IBUPROFEN 800 MG PO TABS: 800.0000 mg | ORAL_TABLET | Freq: Three times a day (TID) | ORAL | Status: DC | PRN

## 2012-08-19 MED ORDER — IBUPROFEN 800 MG PO TABS
800.0000 mg | ORAL_TABLET | Freq: Once | ORAL | Status: AC
  Administered 2012-08-19: 800 mg via ORAL
  Filled 2012-08-19: qty 1

## 2012-08-19 MED ORDER — CYCLOBENZAPRINE HCL 10 MG PO TABS
10.0000 mg | ORAL_TABLET | Freq: Once | ORAL | Status: AC
  Administered 2012-08-19: 10 mg via ORAL
  Filled 2012-08-19: qty 1

## 2012-08-19 MED ORDER — CYCLOBENZAPRINE HCL 10 MG PO TABS: 10.0000 mg | ORAL_TABLET | Freq: Three times a day (TID) | ORAL | Status: DC | PRN

## 2012-08-19 NOTE — ED Provider Notes (Signed)
History     CSN: [id number]  Arrival date & time 08/19/12  1113   First MD Initiated Contact with Patient 08/19/12 1116      Chief Complaint  Patient presents with  . Back Pain    (Consider location/radiation/quality/duration/timing/severity/associated sxs/prior treatment) Patient is a 32 y.o. male presenting with back pain.  Back Pain  Pt reports moderate aching R lower back pain for the last 4 days since lifting a heavy box at work. Pain is worse with sitting up and walking, improved with lying flat. Radiates into R leg at times. No incontinence. No injury. No fever. No IVDA  History reviewed. No pertinent past medical history.  Past Surgical History  Procedure Laterality Date  . Cholecystectomy      No family history on file.  History  Substance Use Topics  . Smoking status: Current Some Day Smoker  . Smokeless tobacco: Not on file  . Alcohol Use: Yes      Review of Systems  Musculoskeletal: Positive for back pain.   All other systems reviewed and are negative except as noted in HPI.   Allergies  Review of patient's allergies indicates no known allergies.  Home Medications   Current Outpatient Rx  Name  Route  Sig  Dispense  Refill  . ranitidine (ZANTAC) 150 MG tablet   Oral   Take 1 tablet (150 mg total) by mouth 2 (two) times daily.   60 tablet   0     BP 156/93  Pulse 81  Temp(Src) 98.2 F (36.8 C) (Oral)  Resp 18  Ht 6\' 3"  (1.905 m)  Wt 300 lb (136.079 kg)  BMI 37.5 kg/m2  SpO2 100%  Physical Exam  Nursing note and vitals reviewed. Constitutional: He is oriented to person, place, and time. He appears well-developed and well-nourished.  HENT:  Head: Normocephalic and atraumatic.  Eyes: EOM are normal. Pupils are equal, round, and reactive to light.  Neck: Normal range of motion. Neck supple.  Cardiovascular: Normal rate, normal heart sounds and intact distal pulses.   Pulmonary/Chest: Effort normal and breath sounds normal.   Abdominal: Bowel sounds are normal. He exhibits no distension. There is no tenderness.  Musculoskeletal: Normal range of motion. He exhibits tenderness (soft tissue paraspinal tenderness R lumbar). He exhibits no edema.  Neurological: He is alert and oriented to person, place, and time. He has normal strength. He displays normal reflexes. No cranial nerve deficit or sensory deficit. He exhibits normal muscle tone.  Skin: Skin is warm and dry. No rash noted.  Psychiatric: He has a normal mood and affect.    ED Course  Procedures (including critical care time)  Labs Reviewed - No data to display No results found.   1. Back pain       MDM  R lower back pain, no Red Flags, possibly some radicular symptoms as well. Will treat with NSAID, muscle relaxer, rest. PCP followup if not improving.         Charles B. Bernette Mayers, MD 08/19/12 1141

## 2012-08-19 NOTE — ED Notes (Signed)
Picked up a box at work and is having lower back pain

## 2012-10-21 ENCOUNTER — Emergency Department (HOSPITAL_BASED_OUTPATIENT_CLINIC_OR_DEPARTMENT_OTHER)
Admission: EM | Admit: 2012-10-21 | Discharge: 2012-10-21 | Disposition: A | Discharge status: Home / Self Care | Payer: Self-pay | Attending: Emergency Medicine | Admitting: Emergency Medicine

## 2012-10-21 ENCOUNTER — Encounter (HOSPITAL_BASED_OUTPATIENT_CLINIC_OR_DEPARTMENT_OTHER): Payer: Self-pay

## 2012-10-21 ENCOUNTER — Emergency Department (HOSPITAL_BASED_OUTPATIENT_CLINIC_OR_DEPARTMENT_OTHER): Payer: Self-pay

## 2012-10-21 DIAGNOSIS — S8392XA Sprain of unspecified site of left knee, initial encounter: Secondary | ICD-10-CM

## 2012-10-21 DIAGNOSIS — IMO0002 Reserved for concepts with insufficient information to code with codable children: Secondary | ICD-10-CM | POA: Insufficient documentation

## 2012-10-21 DIAGNOSIS — I10 Essential (primary) hypertension: Secondary | ICD-10-CM | POA: Insufficient documentation

## 2012-10-21 DIAGNOSIS — F17200 Nicotine dependence, unspecified, uncomplicated: Secondary | ICD-10-CM | POA: Insufficient documentation

## 2012-10-21 DIAGNOSIS — Y9339 Activity, other involving climbing, rappelling and jumping off: Secondary | ICD-10-CM | POA: Insufficient documentation

## 2012-10-21 DIAGNOSIS — Y92410 Unspecified street and highway as the place of occurrence of the external cause: Secondary | ICD-10-CM | POA: Insufficient documentation

## 2012-10-21 DIAGNOSIS — Z79899 Other long term (current) drug therapy: Secondary | ICD-10-CM | POA: Insufficient documentation

## 2012-10-21 HISTORY — DX: Essential (primary) hypertension: I10

## 2012-10-21 MED ORDER — HYDROCODONE-ACETAMINOPHEN 5-325 MG PO TABS: 2.0000 | ORAL_TABLET | ORAL | Status: DC | PRN

## 2012-10-21 NOTE — ED Provider Notes (Signed)
History     CSN: [id number]  Arrival date & time 10/21/12  1206   First MD Initiated Contact with Patient 10/21/12 1227      Chief Complaint  Patient presents with  . Knee Pain    (Consider location/radiation/quality/duration/timing/severity/associated sxs/prior treatment) HPI Comments: She comes to the ER with complaints of left knee injury. Patient reports that the injury occurred 3 weeks ago. He jumped off the back of a truck and came down hard on his leg, has been having pain on the outside portion of the knee ever since. Patient reports increased pain with standing for long periods or walking.  Patient is a 32 y.o. male presenting with knee pain.  Knee Pain   Past Medical History  Diagnosis Date  . Hypertension     Past Surgical History  Procedure Laterality Date  . Cholecystectomy      History reviewed. No pertinent family history.  History  Substance Use Topics  . Smoking status: Current Some Day Smoker    Types: Cigarettes  . Smokeless tobacco: Not on file  . Alcohol Use: Yes      Review of Systems  Musculoskeletal: Positive for arthralgias.    Allergies  Review of patient's allergies indicates no known allergies.  Home Medications   Current Outpatient Rx  Name  Route  Sig  Dispense  Refill  . cyclobenzaprine (FLEXERIL) 10 MG tablet   Oral   Take 1 tablet (10 mg total) by mouth 3 (three) times daily as needed for muscle spasms.   30 tablet   0   . ibuprofen (ADVIL,MOTRIN) 800 MG tablet   Oral   Take 1 tablet (800 mg total) by mouth every 8 (eight) hours as needed for pain.   30 tablet   0   . ranitidine (ZANTAC) 150 MG tablet   Oral   Take 1 tablet (150 mg total) by mouth 2 (two) times daily.   60 tablet   0     BP 149/89  Pulse 89  Temp(Src) 98 F (36.7 C) (Oral)  Resp 18  Ht 6\' 3"  (1.905 m)  Wt 300 lb (136.079 kg)  BMI 37.5 kg/m2  SpO2 98%  Physical Exam  Musculoskeletal:       Left knee: He exhibits swelling. He exhibits  normal range of motion, no effusion, no ecchymosis, no deformity, no erythema, normal alignment, no LCL laxity and no MCL laxity. Tenderness found. Lateral joint line tenderness noted.    ED Course  Procedures (including critical care time)  Labs Reviewed - No data to display Dg Knee Complete 4 Views Left  10/21/2012   *RADIOLOGY REPORT*  Clinical Data: Trauma 3 weeks ago with pain.  LEFT KNEE - COMPLETE 4+ VIEW  Comparison: None.  Findings: Minimal irregularity about the lateral tibial spine, favored to be degenerative.  Limited evaluation for suprapatellar joint effusion, secondary patient body habitus. Small joint effusion difficult to exclude.  IMPRESSION: Mild osseous irregularity about the lateral tibial spine.  This is favored to be degenerative.  If high clinical concern of subacute injury, CT could be performed. Decreased sensitivity and specificity exam due to technique related factors, as described above.   Original Report Authenticated By: Jeronimo Greaves, M.D.     Diagnosis: Left knee injury    MDM  Patient comes to the ER with persistent left knee pain after jumping off the back of a truck. He does have some increased swelling around the knee, specifically anteriorly as well as superolaterally. I  do not feel any tenderness joint effusions, however. There is no obvious ligamentous instability. Patient is able to extend the knee against resistance, but does have some bulging of the lateral quadriceps muscle. Cannot rule out muscle injury, did not feel that he has any sign of tendon rupture. Because of the chronicity of his injury and the fact that I cannot rule out internal derangement, will refer to orthopedics. Patient given knee immobilizer and analgesia.       Gilda Crease, MD 10/21/12 1335

## 2012-10-21 NOTE — ED Notes (Signed)
Awaiting discharge paperwork from MD.

## 2012-10-21 NOTE — ED Notes (Signed)
Pt states that he has knee pain in the L knee.  Pt states that he has not seen a doctor for this pain.

## 2013-02-11 ENCOUNTER — Encounter (HOSPITAL_BASED_OUTPATIENT_CLINIC_OR_DEPARTMENT_OTHER): Payer: Self-pay

## 2013-02-11 ENCOUNTER — Emergency Department (HOSPITAL_BASED_OUTPATIENT_CLINIC_OR_DEPARTMENT_OTHER)
Admission: EM | Admit: 2013-02-11 | Discharge: 2013-02-11 | Disposition: A | Discharge status: Home / Self Care | Payer: Self-pay | Attending: Emergency Medicine | Admitting: Emergency Medicine

## 2013-02-11 DIAGNOSIS — M549 Dorsalgia, unspecified: Secondary | ICD-10-CM | POA: Insufficient documentation

## 2013-02-11 DIAGNOSIS — I10 Essential (primary) hypertension: Secondary | ICD-10-CM | POA: Insufficient documentation

## 2013-02-11 DIAGNOSIS — F17200 Nicotine dependence, unspecified, uncomplicated: Secondary | ICD-10-CM | POA: Insufficient documentation

## 2013-02-11 MED ORDER — DIAZEPAM 5 MG PO TABS
5.0000 mg | ORAL_TABLET | Freq: Once | ORAL | Status: AC
  Administered 2013-02-11: 5 mg via ORAL
  Filled 2013-02-11: qty 1

## 2013-02-11 MED ORDER — DIAZEPAM 5 MG PO TABS: 5.0000 mg | ORAL_TABLET | Freq: Two times a day (BID) | ORAL | Status: AC

## 2013-02-11 MED ORDER — IBUPROFEN 800 MG PO TABS: 800.0000 mg | ORAL_TABLET | Freq: Three times a day (TID) | ORAL | Status: AC

## 2013-02-11 MED ORDER — HYDROCODONE-ACETAMINOPHEN 5-325 MG PO TABS: 2.0000 | ORAL_TABLET | Freq: Four times a day (QID) | ORAL | Status: DC | PRN

## 2013-02-11 MED ORDER — HYDROCODONE-ACETAMINOPHEN 5-325 MG PO TABS
2.0000 | ORAL_TABLET | Freq: Once | ORAL | Status: AC
  Administered 2013-02-11: 2 via ORAL
  Filled 2013-02-11: qty 2

## 2013-02-11 NOTE — ED Notes (Signed)
Lower back pain x 3 days-denies injury

## 2013-02-11 NOTE — ED Provider Notes (Signed)
CSN: [id number]     Arrival date & time 02/11/13  1854 History   This chart was scribed for Derrick Munch, MD by Arlan Organ, ED Scribe. This patient was seen in room MH11/MH11 and the patient's care was started at 7:31 PM.    Chief Complaint  Patient presents with  . Back Pain   (Consider location/radiation/quality/duration/timing/severity/associated sxs/prior Treatment) The history is provided by the patient. No language interpreter was used.   HPI Comments: Derrick Holden is a 32 y.o. male who presents to the Emergency Department complaining of back pain that started about 11 PM on Saturday (2 days ago). Pt states he was bending over and noticed the pain as he has coming back up. He states that ambulation worsens the pain. Pt denies swelling, incontinence, fever, behavorial changes, weakness, and abdominal pain. Pt currently works at a [employer].    Past Medical History  Diagnosis Date  . Hypertension    Past Surgical History  Procedure Laterality Date  . Cholecystectomy     No family history on file. History  Substance Use Topics  . Smoking status: Current Some Day Smoker    Types: Cigarettes  . Smokeless tobacco: Not on file  . Alcohol Use: Yes    Review of Systems  Constitutional:       Per HPI, otherwise negative  HENT:       Per HPI, otherwise negative  Respiratory:       Per HPI, otherwise negative  Cardiovascular:       Per HPI, otherwise negative  Gastrointestinal: Negative for vomiting.  Endocrine:       Negative aside from HPI  Genitourinary:       Neg aside from HPI   Musculoskeletal:       Per HPI, otherwise negative  Skin: Negative.   Neurological: Negative for syncope.    Allergies  Review of patient's allergies indicates no known allergies.  Home Medications   Current Outpatient Rx  Name  Route  Sig  Dispense  Refill  . cyclobenzaprine (FLEXERIL) 10 MG tablet   Oral   Take 1 tablet (10 mg total) by mouth 3 (three) times  daily as needed for muscle spasms.   30 tablet   0   . HYDROcodone-acetaminophen (NORCO/VICODIN) 5-325 MG per tablet   Oral   Take 2 tablets by mouth every 4 (four) hours as needed for pain.   20 tablet   0   . ibuprofen (ADVIL,MOTRIN) 800 MG tablet   Oral   Take 1 tablet (800 mg total) by mouth every 8 (eight) hours as needed for pain.   30 tablet   0   . ranitidine (ZANTAC) 150 MG tablet   Oral   Take 1 tablet (150 mg total) by mouth 2 (two) times daily.   60 tablet   0    BP 158/101  Pulse 91  Temp(Src) 98.6 F (37 C) (Oral)  Resp 16  Ht 6\' 3"  (1.905 m)  Wt 300 lb (136.079 kg)  BMI 37.5 kg/m2  SpO2 99% Physical Exam  Nursing note and vitals reviewed. Constitutional: He is oriented to person, place, and time. He appears well-developed. No distress.  HENT:  Head: Normocephalic and atraumatic.  Eyes: Conjunctivae and EOM are normal.  Cardiovascular: Normal rate and regular rhythm.   Pulmonary/Chest: Effort normal. No stridor. No respiratory distress.  Abdominal: He exhibits no distension. There is no tenderness.  Musculoskeletal: He exhibits no edema.  Positive straight leg raise  Neurological: He is alert and oriented to person, place, and time.  Skin: Skin is warm and dry.  Psychiatric: He has a normal mood and affect.    ED Course  Procedures (including critical care time)  DIAGNOSTIC STUDIES:   COORDINATION OF CARE: 7:32 PM-Discussed treatment plan which includes ice, medications, and rest. Will give pt ortho follow up incase things do not resolve. Pt agreed to plan.     Labs Review Labs Reviewed - No data to display Imaging Review No results found.  MDM  No diagnosis found.  I personally performed the services described in this documentation, which was scribed in my presence. The recorded information has been reviewed and is accurate.   This generally well-appearing male presents with back pain.  Patient has no red flag, and on exam it is in  no distress with no neurologic deficits.  Patient was started on analgesics, also like symptoms, anti-inflammatories, discharged with primary care followup.  Derrick Munch, MD 02/11/13 1950

## 2015-07-05 ENCOUNTER — Encounter (HOSPITAL_BASED_OUTPATIENT_CLINIC_OR_DEPARTMENT_OTHER): Payer: Self-pay | Admitting: Emergency Medicine

## 2015-07-05 ENCOUNTER — Emergency Department (HOSPITAL_BASED_OUTPATIENT_CLINIC_OR_DEPARTMENT_OTHER)
Admission: EM | Admit: 2015-07-05 | Discharge: 2015-07-05 | Disposition: A | Discharge status: Home / Self Care | Payer: Managed Care, Other (non HMO) | Attending: Emergency Medicine | Admitting: Emergency Medicine

## 2015-07-05 DIAGNOSIS — Z79899 Other long term (current) drug therapy: Secondary | ICD-10-CM | POA: Insufficient documentation

## 2015-07-05 DIAGNOSIS — I10 Essential (primary) hypertension: Secondary | ICD-10-CM | POA: Insufficient documentation

## 2015-07-05 DIAGNOSIS — F17210 Nicotine dependence, cigarettes, uncomplicated: Secondary | ICD-10-CM | POA: Diagnosis not present

## 2015-07-05 DIAGNOSIS — J069 Acute upper respiratory infection, unspecified: Secondary | ICD-10-CM | POA: Insufficient documentation

## 2015-07-05 DIAGNOSIS — J029 Acute pharyngitis, unspecified: Secondary | ICD-10-CM | POA: Diagnosis present

## 2015-07-05 LAB — RAPID STREP SCREEN (MED CTR MEBANE ONLY): Streptococcus, Group A Screen (Direct): NEGATIVE

## 2015-07-05 MED ORDER — PSEUDOEPHEDRINE-GUAIFENESIN ER 60-600 MG PO TB12: 1.0000 | ORAL_TABLET | Freq: Two times a day (BID) | ORAL | Status: DC

## 2015-07-05 NOTE — ED Notes (Signed)
Pt c/o congestion, hot/cold flashes, diarrhea and body aches for past 2 days.

## 2015-07-05 NOTE — ED Provider Notes (Signed)
CSN: [id number]     Arrival date & time 07/05/15  1818 History   First MD Initiated Contact with Patient 07/05/15 1949     Chief Complaint  Patient presents with  . Sore Throat     (Consider location/radiation/quality/duration/timing/severity/associated sxs/prior Treatment) HPI Derrick Holden is a 35 y.o. male history of hypertension who comes in for evaluation of nasal congestion, sore throat, and productive yellow cough. Patient reports symptoms have been ongoing for the past 2 days. He has tried Alka-Seltzer plus without relief of his symptoms. Denies any fevers, chills, chest pain, shortness of breath, nausea or vomiting, abdominal pain, urinary symptoms, diarrhea or constipation, leg swelling. Denies any other medical complaints at this time. No other modifying factors.   Past Medical History  Diagnosis Date  . Hypertension    Past Surgical History  Procedure Laterality Date  . Cholecystectomy     No family history on file. Social History  Substance Use Topics  . Smoking status: Current Some Day Smoker    Types: Cigarettes  . Smokeless tobacco: None  . Alcohol Use: Yes    Review of Systems A 10 point review of systems was completed and was negative except for pertinent positives and negatives as mentioned in the history of present illness     Allergies  Review of patient's allergies indicates no known allergies.  Home Medications   Prior to Admission medications   Medication Sig Start Date End Date Taking? Authorizing Provider  cyclobenzaprine (FLEXERIL) 10 MG tablet Take 1 tablet (10 mg total) by mouth 3 (three) times daily as needed for muscle spasms. 08/19/12   Susy Frizzle, MD  HYDROcodone-acetaminophen (NORCO/VICODIN) 5-325 MG per tablet Take 2 tablets by mouth every 6 (six) hours as needed for pain. 02/11/13   Gerhard Munch, MD  pseudoephedrine-guaifenesin [facility] D) 60-600 MG 12 hr tablet Take 1 tablet by mouth every 12 (twelve) hours. 07/05/15   Joycie Peek, PA-C  ranitidine (ZANTAC) 150 MG tablet Take 1 tablet (150 mg total) by mouth 2 (two) times daily. 08/03/12   Jerelyn Scott, MD   BP 145/97 mmHg  Pulse 83  Temp(Src) 98.3 F (36.8 C) (Oral)  Resp 18  Ht  (1.93 m)  Wt 145.151 kg  BMI 38.97 kg/m2  SpO2 97% Physical Exam  Constitutional: He is oriented to person, place, and time. He appears well-developed and well-nourished. No distress.  Well appearing African-American male  HENT:  Head: Normocephalic and atraumatic.  Mouth/Throat: Oropharynx is clear and moist.  Eyes: Conjunctivae are normal. Pupils are equal, round, and reactive to light. Right eye exhibits no discharge. Left eye exhibits no discharge. No scleral icterus.  Neck: Normal range of motion. Neck supple.  No meningismus  Cardiovascular: Normal rate, regular rhythm and normal heart sounds.   Pulmonary/Chest: Effort normal and breath sounds normal. No respiratory distress. He has no wheezes. He has no rales.  Abdominal: Soft. There is no tenderness.  Musculoskeletal: Normal range of motion. He exhibits no edema or tenderness.  Neurological: He is alert and oriented to person, place, and time.  Cranial Nerves II-XII grossly intact  Skin: Skin is warm and dry. No rash noted. He is not diaphoretic.  Psychiatric: He has a normal mood and affect.  Nursing note and vitals reviewed.   ED Course  Procedures (including critical care time) Labs Review Labs Reviewed  RAPID STREP SCREEN (NOT AT [facility])  CULTURE, GROUP A STREP [facility])    Imaging Review No results found. I have personally  reviewed and evaluated these images and lab results as part of my medical decision-making.   EKG Interpretation None     Meds given in ED:  Medications - No data to display  Discharge Medication List as of 07/05/2015  8:16 PM    START taking these medications   Details  pseudoephedrine-guaifenesin (MUCINEX D) 60-600 MG 12 hr tablet Take 1 tablet by mouth every 12 (twelve)  hours., Starting 07/05/2015, Until Discontinued, Print       Filed Vitals:   07/05/15 1824 07/05/15 1849 07/05/15 2007  BP: 161/107 179/118 145/97  Pulse: 96  83  Temp: 98.3 F (36.8 C)    TempSrc: Oral    Resp: 18  18  Height:  (1.93 m)    Weight: 145.151 kg    SpO2: 100%  97%    MDM  Patients symptoms are consistent with URI, likely viral etiology. Benign cardiopulmonary exam, afebrile. Doubt pneumonia. Discussed that antibiotics are not indicated for viral infections. Pt will be discharged with symptomatic treatment.  Verbalizes understanding and is agreeable with plan. Pt is hemodynamically stable & in NAD prior to dc. Agrees to follow PCP next week for reevaluation.  The patient appears reasonably screened and/or stabilized for discharge and I doubt any other medical condition or other [facility] requiring further screening, evaluation, or treatment in the ED at this time prior to discharge.    Final diagnoses:  URI (upper respiratory infection)        Joycie Peek, PA-C 07/05/15 2034  Jerelyn Scott, MD 07/05/15 2035

## 2015-07-05 NOTE — Discharge Instructions (Signed)
Your symptoms are likely due to to a virus and will resolve on their own. Take your medications as prescribed. Follow-up with your doctor next week for reevaluation. Return to ED for any new or worsening symptoms.  Upper Respiratory Infection, Adult Most upper respiratory infections (URIs) are a viral infection of the air passages leading to the lungs. A URI affects the nose, throat, and upper air passages. The most common type of URI is nasopharyngitis and is typically referred to as "the common cold." URIs run their course and usually go away on their own. Most of the time, a URI does not require medical attention, but sometimes a bacterial infection in the upper airways can follow a viral infection. This is called a secondary infection. Sinus and middle ear infections are common types of secondary upper respiratory infections. Bacterial pneumonia can also complicate a URI. A URI can worsen asthma and chronic obstructive pulmonary disease (COPD). Sometimes, these complications can require emergency medical care and may be life threatening.  CAUSES Almost all URIs are caused by viruses. A virus is a type of germ and can spread from one person to another.  RISKS FACTORS You may be at risk for a URI if:   You smoke.   You have chronic heart or lung disease.  You have a weakened defense (immune) system.   You are very young or very old.   You have nasal allergies or asthma.  You work in crowded or poorly ventilated areas.  You work in health care facilities or schools. SIGNS AND SYMPTOMS  Symptoms typically develop 2-3 days after you come in contact with a cold virus. Most viral URIs last 7-10 days. However, viral URIs from the influenza virus (flu virus) can last 14-18 days and are typically more severe. Symptoms may include:   Runny or stuffy (congested) nose.   Sneezing.   Cough.   Sore throat.   Headache.   Fatigue.   Fever.   Loss of appetite.   Pain in your  forehead, behind your eyes, and over your cheekbones (sinus pain).  Muscle aches.  DIAGNOSIS  Your health care provider may diagnose a URI by:  Physical exam.  Tests to check that your symptoms are not due to another condition such as:  Strep throat.  Sinusitis.  Pneumonia.  Asthma. TREATMENT  A URI goes away on its own with time. It cannot be cured with medicines, but medicines may be prescribed or recommended to relieve symptoms. Medicines may help:  Reduce your fever.  Reduce your cough.  Relieve nasal congestion. HOME CARE INSTRUCTIONS   Take medicines only as directed by your health care provider.   Gargle warm saltwater or take cough drops to comfort your throat as directed by your health care provider.  Use a warm mist humidifier or inhale steam from a shower to increase air moisture. This may make it easier to breathe.  Drink enough fluid to keep your urine clear or pale yellow.   Eat soups and other clear broths and maintain good nutrition.   Rest as needed.   Return to work when your temperature has returned to normal or as your health care provider advises. You may need to stay home longer to avoid infecting others. You can also use a face mask and careful hand washing to prevent spread of the virus.  Increase the usage of your inhaler if you have asthma.   Do not use any tobacco products, including cigarettes, chewing tobacco, or electronic cigarettes.  If you need help quitting, ask your health care provider. PREVENTION  The best way to protect yourself from getting a cold is to practice good hygiene.   Avoid oral or hand contact with people with cold symptoms.   Wash your hands often if contact occurs.  There is no clear evidence that vitamin C, vitamin E, echinacea, or exercise reduces the chance of developing a cold. However, it is always recommended to get plenty of rest, exercise, and practice good nutrition.  SEEK MEDICAL CARE IF:   You  are getting worse rather than better.   Your symptoms are not controlled by medicine.   You have chills.  You have worsening shortness of breath.  You have brown or red mucus.  You have yellow or brown nasal discharge.  You have pain in your face, especially when you bend forward.  You have a fever.  You have swollen neck glands.  You have pain while swallowing.  You have white areas in the back of your throat. SEEK IMMEDIATE MEDICAL CARE IF:   You have severe or persistent:  Headache.  Ear pain.  Sinus pain.  Chest pain.  You have chronic lung disease and any of the following:  Wheezing.  Prolonged cough.  Coughing up blood.  A change in your usual mucus.  You have a stiff neck.  You have changes in your:  Vision.  Hearing.  Thinking.  Mood. MAKE SURE YOU:   Understand these instructions.  Will watch your condition.  Will get help right away if you are not doing well or get worse.   This information is not intended to replace advice given to you by your health care provider. Make sure you discuss any questions you have with your health care provider.   Document Released: 11/16/2000 Document Revised: 10/07/2014 Document Reviewed: 08/28/2013 Elsevier Interactive Patient Education [employer]

## 2015-07-08 LAB — CULTURE, GROUP A STREP (THRC)

## 2015-09-15 ENCOUNTER — Emergency Department (HOSPITAL_BASED_OUTPATIENT_CLINIC_OR_DEPARTMENT_OTHER)
Admission: EM | Admit: 2015-09-15 | Discharge: 2015-09-15 | Disposition: A | Discharge status: Home / Self Care | Payer: BLUE CROSS/BLUE SHIELD | Attending: Emergency Medicine | Admitting: Emergency Medicine

## 2015-09-15 ENCOUNTER — Encounter (HOSPITAL_BASED_OUTPATIENT_CLINIC_OR_DEPARTMENT_OTHER): Payer: Self-pay | Admitting: *Deleted

## 2015-09-15 DIAGNOSIS — Z9049 Acquired absence of other specified parts of digestive tract: Secondary | ICD-10-CM | POA: Diagnosis not present

## 2015-09-15 DIAGNOSIS — K529 Noninfective gastroenteritis and colitis, unspecified: Secondary | ICD-10-CM | POA: Diagnosis not present

## 2015-09-15 DIAGNOSIS — R112 Nausea with vomiting, unspecified: Secondary | ICD-10-CM | POA: Diagnosis present

## 2015-09-15 DIAGNOSIS — I10 Essential (primary) hypertension: Secondary | ICD-10-CM | POA: Insufficient documentation

## 2015-09-15 DIAGNOSIS — F17210 Nicotine dependence, cigarettes, uncomplicated: Secondary | ICD-10-CM | POA: Diagnosis not present

## 2015-09-15 MED ORDER — ONDANSETRON HCL 4 MG PO TABS: 4.0000 mg | ORAL_TABLET | Freq: Four times a day (QID) | ORAL | Status: DC

## 2015-09-15 NOTE — Discharge Instructions (Signed)

## 2015-09-15 NOTE — ED Provider Notes (Signed)
CSN: [id number]     Arrival date & time 09/15/15  2042 History   First MD Initiated Contact with Patient 09/15/15 2142     Chief Complaint  Patient presents with  . Emesis   HPI  Mr. Cindra Presumeorrence is a 35 year old male presenting with emesis and diarrhea. He reports eating a sausage biscuit this morning and became acutely nauseous seen afterwards. He vomited 6 times and had 3 episodes of loose stools. He denies blood in his vomit or stool. He denies abdominal pain with his symptoms. He states that he felt well enough to go to work. He works at a [employer] and became acutely nauseous and vomited once he arrived to work at the smell of raw chicken. After leaving work, all symptoms resolved and he states he is feeling back to normal. He is requesting that we fill out paperwork stating that he can return to work. He denies fevers, chills, abdominal pain, nausea, vomiting, diarrhea, dysuria or any other symptoms at this time.  Past Medical History  Diagnosis Date  . Hypertension    Past Surgical History  Procedure Laterality Date  . Cholecystectomy     No family history on file. Social History  Substance Use Topics  . Smoking status: Current Some Day Smoker    Types: Cigars  . Smokeless tobacco: Never Used  . Alcohol Use: Yes     Comment: rare    Review of Systems  All other systems reviewed and are negative.     Allergies  Review of patient's allergies indicates no known allergies.  Home Medications   Prior to Admission medications   Medication Sig Start Date End Date Taking? Authorizing Provider  cyclobenzaprine (FLEXERIL) 10 MG tablet Take 1 tablet (10 mg total) by mouth 3 (three) times daily as needed for muscle spasms. 08/19/12   Susy Frizzleharles Sheldon, MD  HYDROcodone-acetaminophen (NORCO/VICODIN) 5-325 MG per tablet Take 2 tablets by mouth every 6 (six) hours as needed for pain. 02/11/13   Gerhard Munchobert Lockwood, MD  ondansetron (ZOFRAN) 4 MG tablet Take 1 tablet (4 mg total) by mouth  every 6 (six) hours. 09/15/15   Hilma Steinhilber, PA-C  pseudoephedrine-guaifenesin (MUCINEX D) 60-600 MG 12 hr tablet Take 1 tablet by mouth every 12 (twelve) hours. 07/05/15   Joycie PeekBenjamin Cartner, PA-C  ranitidine (ZANTAC) 150 MG tablet Take 1 tablet (150 mg total) by mouth 2 (two) times daily. 08/03/12   Jerelyn ScottMartha Linker, MD   BP 155/98 mmHg  Pulse 94  Temp(Src) 98.3 F (36.8 C) (Oral)  Resp 18  Ht 6\' 4"  (1.93 m)  Wt 145.151 kg  BMI 38.97 kg/m2  SpO2 100% Physical Exam  Constitutional: He appears well-developed and well-nourished. No distress.  Nontoxic-appearing  HENT:  Head: Normocephalic and atraumatic.  Eyes: Conjunctivae are normal. Right eye exhibits no discharge. Left eye exhibits no discharge. No scleral icterus.  Neck: Normal range of motion.  Cardiovascular: Normal rate, regular rhythm and normal heart sounds.   Pulmonary/Chest: Effort normal. No respiratory distress.  Abdominal: Soft. Bowel sounds are normal. He exhibits no distension. There is no tenderness. There is no rebound and no guarding.  Musculoskeletal: Normal range of motion.  Neurological: He is alert. Coordination normal.  Skin: Skin is warm and dry.  Psychiatric: He has a normal mood and affect. His behavior is normal.  Nursing note and vitals reviewed.   ED Course  Procedures (including critical care time) Labs Review Labs Reviewed - No data to display  Imaging Review No results found.  I have personally reviewed and evaluated these images and lab results as part of my medical decision-making.   EKG Interpretation None      MDM   Final diagnoses:  Gastroenteritis   35 year old male presenting with nausea, vomiting and diarrhea after eating a sausage biscuit this morning. He is requesting a note stating that he can return to work. He denies all symptoms at this time. Afebrile and hemodynamically stable. Patient is nontoxic-appearing. Abdomen is soft, nontender without peritoneal signs. Patient declines  blood work at this time stating he just needs a work note. Patient symptoms consistent with gastroenteritis; possibly from food poisoning. Agree that patient does not need blood work at this time and given strict return precautions. He is tolerating PO before discharge. Discharged with prescription for Zofran in case the nausea returns.    Rolm Gala Malon Branton, PA-C 09/15/15 2300  Geoffery Lyons, MD 09/15/15 2302

## 2015-09-15 NOTE — ED Notes (Signed)
Pt reports vomiting x 4 since 1000 am. Derrick FlesherWent to work and was sent to ED because he was throwing up. Ambulatory with steady gait to triage

## 2015-09-15 NOTE — ED Notes (Signed)
Pt. Reports he ate a sausage biscuit this morning and has vomited approx. 6 times and had 3 episodes of diarrhea.  All stopped around 1930.  Pt. Reports he now feels fine.  Pt. Reports he was still sick when he got to work and was sent home with the paper work to see a Dr. Before he can return to work.

## 2015-11-08 ENCOUNTER — Encounter (HOSPITAL_BASED_OUTPATIENT_CLINIC_OR_DEPARTMENT_OTHER): Payer: Self-pay | Admitting: *Deleted

## 2015-11-08 DIAGNOSIS — I10 Essential (primary) hypertension: Secondary | ICD-10-CM | POA: Insufficient documentation

## 2015-11-08 DIAGNOSIS — F17210 Nicotine dependence, cigarettes, uncomplicated: Secondary | ICD-10-CM | POA: Insufficient documentation

## 2015-11-08 NOTE — ED Notes (Signed)
HTN and HA x several days.

## 2015-11-09 ENCOUNTER — Emergency Department (HOSPITAL_BASED_OUTPATIENT_CLINIC_OR_DEPARTMENT_OTHER)
Admission: EM | Admit: 2015-11-09 | Discharge: 2015-11-09 | Disposition: A | Discharge status: Home / Self Care | Payer: BLUE CROSS/BLUE SHIELD | Attending: Emergency Medicine | Admitting: Emergency Medicine

## 2015-11-09 ENCOUNTER — Encounter (HOSPITAL_BASED_OUTPATIENT_CLINIC_OR_DEPARTMENT_OTHER): Payer: Self-pay | Admitting: Emergency Medicine

## 2015-11-09 DIAGNOSIS — I10 Essential (primary) hypertension: Secondary | ICD-10-CM

## 2015-11-09 LAB — BASIC METABOLIC PANEL
Anion gap: 7 (ref 5–15)
BUN: 13 mg/dL (ref 6–20)
CHLORIDE: 106 mmol/L (ref 101–111)
CO2: 25 mmol/L (ref 22–32)
Calcium: 8.9 mg/dL (ref 8.9–10.3)
Creatinine, Ser: 0.95 mg/dL (ref 0.61–1.24)
GFR calc Af Amer: >60 mL/min (ref >60)
GFR calc non Af Amer: >60 mL/min (ref >60)
GLUCOSE: 85 mg/dL (ref 65–99)
POTASSIUM: 3.5 mmol/L (ref 3.5–5.1)
Sodium: 138 mmol/L (ref 135–145)

## 2015-11-09 LAB — URINALYSIS, ROUTINE W REFLEX MICROSCOPIC
Bilirubin Urine: NEGATIVE
GLUCOSE, UA: NEGATIVE mg/dL
Ketones, ur: NEGATIVE mg/dL
Leukocytes, UA: NEGATIVE
Nitrite: NEGATIVE
Protein, ur: NEGATIVE mg/dL
SPECIFIC GRAVITY, URINE: 1.016 (ref 1.005–1.030)
pH: 6 (ref 5.0–8.0)

## 2015-11-09 LAB — URINE MICROSCOPIC-ADD ON: WBC, UA: NONE SEEN WBC/hpf (ref 0–5)

## 2015-11-09 LAB — CBC WITH DIFFERENTIAL/PLATELET
Basophils Absolute: 0 10*3/uL (ref 0.0–0.1)
Basophils Relative: 0 %
Eosinophils Absolute: 0.3 10*3/uL (ref 0.0–0.7)
Eosinophils Relative: 4 %
HEMATOCRIT: 40.6 % (ref 39.0–52.0)
HEMOGLOBIN: 13.3 g/dL (ref 13.0–17.0)
Lymphocytes Relative: 21 %
Lymphs Abs: 1.4 10*3/uL (ref 0.7–4.0)
MCH: 28.1 pg (ref 26.0–34.0)
MCHC: 32.8 g/dL (ref 30.0–36.0)
MCV: 85.7 fL (ref 78.0–100.0)
Monocytes Absolute: 0.6 10*3/uL (ref 0.1–1.0)
Monocytes Relative: 9 %
NEUTROS ABS: 4.5 10*3/uL (ref 1.7–7.7)
NEUTROS PCT: 66 %
Platelets: 293 10*3/uL (ref 150–400)
RBC: 4.74 MIL/uL (ref 4.22–5.81)
RDW: 13.4 % (ref 11.5–15.5)
WBC: 6.8 10*3/uL (ref 4.0–10.5)

## 2015-11-09 NOTE — ED Notes (Signed)
Pt states he has been checking his BP lately and it has been elevated. Also c/o H/A.

## 2015-11-09 NOTE — Discharge Instructions (Signed)
DASH Eating Plan  DASH stands for "Dietary Approaches to Stop Hypertension." The DASH eating plan is a healthy eating plan that has been shown to reduce high blood pressure (hypertension). Additional health benefits may include reducing the risk of type 2 diabetes mellitus, heart disease, and stroke. The DASH eating plan may also help with weight loss.  WHAT DO I NEED TO KNOW ABOUT THE DASH EATING PLAN?  For the DASH eating plan, you will follow these general guidelines:  · Choose foods with a percent daily value for sodium of less than 5% (as listed on the food label).  · Use salt-free seasonings or herbs instead of table salt or sea salt.  · Check with your health care provider or pharmacist before using salt substitutes.  · Eat lower-sodium products, often labeled as "lower sodium" or "no salt added."  · Eat fresh foods.  · Eat more vegetables, fruits, and low-fat dairy products.  · Choose whole grains. Look for the word "whole" as the first word in the ingredient list.  · Choose fish and skinless chicken or turkey more often than red meat. Limit fish, poultry, and meat to 6 oz (170 g) each day.  · Limit sweets, desserts, sugars, and sugary drinks.  · Choose heart-healthy fats.  · Limit cheese to 1 oz (28 g) per day.  · Eat more home-cooked food and less restaurant, buffet, and fast food.  · Limit fried foods.  · Cook foods using methods other than frying.  · Limit canned vegetables. If you do use them, rinse them well to decrease the sodium.  · When eating at a restaurant, ask that your food be prepared with less salt, or no salt if possible.  WHAT FOODS CAN I EAT?  Seek help from a dietitian for individual calorie needs.  Grains  Whole grain or whole wheat bread. Brown rice. Whole grain or whole wheat pasta. Quinoa, bulgur, and whole grain cereals. Low-sodium cereals. Corn or whole wheat flour tortillas. Whole grain cornbread. Whole grain crackers. Low-sodium crackers.  Vegetables  Fresh or frozen vegetables  (raw, steamed, roasted, or grilled). Low-sodium or reduced-sodium tomato and vegetable juices. Low-sodium or reduced-sodium tomato sauce and paste. Low-sodium or reduced-sodium canned vegetables.   Fruits  All fresh, canned (in natural juice), or frozen fruits.  Meat and Other Protein Products  Ground beef (85% or leaner), grass-fed beef, or beef trimmed of fat. Skinless chicken or turkey. Ground chicken or turkey. Pork trimmed of fat. All fish and seafood. Eggs. Dried beans, peas, or lentils. Unsalted nuts and seeds. Unsalted canned beans.  Dairy  Low-fat dairy products, such as skim or 1% milk, 2% or reduced-fat cheeses, low-fat ricotta or cottage cheese, or plain low-fat yogurt. Low-sodium or reduced-sodium cheeses.  Fats and Oils  Tub margarines without trans fats. Light or reduced-fat mayonnaise and salad dressings (reduced sodium). Avocado. Safflower, olive, or canola oils. Natural peanut or almond butter.  Other  Unsalted popcorn and pretzels.  The items listed above may not be a complete list of recommended foods or beverages. Contact your dietitian for more options.  WHAT FOODS ARE NOT RECOMMENDED?  Grains  White bread. White pasta. White rice. Refined cornbread. Bagels and croissants. Crackers that contain trans fat.  Vegetables  Creamed or fried vegetables. Vegetables in a cheese sauce. Regular canned vegetables. Regular canned tomato sauce and paste. Regular tomato and vegetable juices.  Fruits  Dried fruits. Canned fruit in light or heavy syrup. Fruit juice.  Meat and Other Protein   Products  Fatty cuts of meat. Ribs, chicken wings, bacon, sausage, bologna, salami, chitterlings, fatback, hot dogs, bratwurst, and packaged luncheon meats. Salted nuts and seeds. Canned beans with salt.  Dairy  Whole or 2% milk, cream, half-and-half, and cream cheese. Whole-fat or sweetened yogurt. Full-fat cheeses or blue cheese. Nondairy creamers and whipped toppings. Processed cheese, cheese spreads, or cheese  curds.  Condiments  Onion and garlic salt, seasoned salt, table salt, and sea salt. Canned and packaged gravies. Worcestershire sauce. Tartar sauce. Barbecue sauce. Teriyaki sauce. Soy sauce, including reduced sodium. Steak sauce. Fish sauce. Oyster sauce. Cocktail sauce. Horseradish. Ketchup and mustard. Meat flavorings and tenderizers. Bouillon cubes. Hot sauce. Tabasco sauce. Marinades. Taco seasonings. Relishes.  Fats and Oils  Butter, stick margarine, lard, shortening, ghee, and bacon fat. Coconut, palm kernel, or palm oils. Regular salad dressings.  Other  Pickles and olives. Salted popcorn and pretzels.  The items listed above may not be a complete list of foods and beverages to avoid. Contact your dietitian for more information.  WHERE CAN I FIND MORE INFORMATION?  National Heart, Lung, and Blood Institute: www.nhlbi.nih.gov/health/health-topics/topics/dash/     This information is not intended to replace advice given to you by your health care provider. Make sure you discuss any questions you have with your health care provider.     Document Released: 05/12/2011 Document Revised: 06/13/2014 Document Reviewed: 03/27/2013  Elsevier Interactive Patient Education ©2016 [employer]

## 2015-11-09 NOTE — ED Notes (Signed)
Pt does not have a primary care MD. Primary care information sheet given to pt.

## 2015-11-09 NOTE — ED Notes (Signed)
MD at bedside. 

## 2015-11-09 NOTE — ED Provider Notes (Signed)
CSN: [id number]     Arrival date & time 11/08/15  2109 History  By signing my name below, I, Majel Homer, attest that this documentation has been prepared under the direction and in the presence of Henessy Rohrer, MD . Electronically Signed: Majel Homer, Scribe. 11/09/2015. 12:44 AM.    Chief Complaint  Patient presents with  . Hypertension   Patient is a 35 y.o. male presenting with hypertension. The history is provided by the patient. No language interpreter was used.  Hypertension This is a recurrent problem. The current episode started more than 1 week ago. The problem occurs constantly. The problem has not changed since onset.Pertinent negatives include no chest pain, no abdominal pain, no headaches and no shortness of breath. Nothing aggravates the symptoms. Nothing relieves the symptoms. He has tried nothing for the symptoms. The treatment provided no relief.   HPI Comments:  Derrick Holden is a 35 y.o. male with PMHx of HTN,  who presents to the Emergency Department complaining of HTN for several days (Max 180/115). His BP is 162/115 at this current time. Pt states he has never taken any medication for HTN and does not currently have a PCP. He also denies eating many salty foods. Pt notes he usually always drinks water. He denies any other complaints. No modifying factors noted.    Past Medical History  Diagnosis Date  . Hypertension    Past Surgical History  Procedure Laterality Date  . Cholecystectomy     History reviewed. No pertinent family history. Social History  Substance Use Topics  . Smoking status: Current Some Day Smoker    Types: Cigars  . Smokeless tobacco: Never Used  . Alcohol Use: Yes     Comment: rare    Review of Systems  Constitutional: Negative for fever.       +HTN  Respiratory: Negative for shortness of breath.   Cardiovascular: Negative for chest pain.  Gastrointestinal: Negative for nausea, vomiting and abdominal pain.  Neurological: Negative for  dizziness, facial asymmetry, weakness, numbness and headaches.  All other systems reviewed and are negative.     Allergies  Review of patient's allergies indicates no known allergies.  Home Medications   Prior to Admission medications   Not on File   BP 162/115 mmHg  Pulse 87  Temp(Src) 98.6 F (37 C) (Oral)  Resp 18  Ht  (1.93 m)  Wt 325 lb (147.419 kg)  BMI 39.58 kg/m2  SpO2 99% Physical Exam  Constitutional: He is oriented to person, place, and time. He appears well-developed and well-nourished. No distress.  HENT:  Head: Normocephalic and atraumatic.  Mouth/Throat: Oropharynx is clear and moist. No oropharyngeal exudate.  Moist mucous membranes   Eyes: Conjunctivae are normal. Pupils are equal, round, and reactive to light.  Neck: Normal range of motion. Neck supple. No JVD present.  Trachea midline No bruit  Cardiovascular: Normal rate, regular rhythm and normal heart sounds.   Pulmonary/Chest: Effort normal and breath sounds normal. No stridor. No respiratory distress. He has no wheezes. He has no rales.  Abdominal: Soft. Bowel sounds are normal. He exhibits no distension. There is no tenderness. There is no rebound and no guarding.  Musculoskeletal: Normal range of motion. He exhibits no edema or tenderness.  5/5 strength to BUE  Neurological: He is alert and oriented to person, place, and time. He has normal reflexes.  No neurological deficits  Skin: Skin is warm and dry.  Psychiatric: He has a normal mood and affect.  His behavior is normal.  Nursing note and vitals reviewed.   ED Course  Procedures  DIAGNOSTIC STUDIES:  Oxygen Saturation is 99% on RA, normal by my interpretation.    COORDINATION OF CARE:  12:37 AM Discussed treatment plan with pt at bedside and pt agreed to plan.  Labs Review Labs Reviewed - No data to display  Imaging Review No results found. I have personally reviewed and evaluated these images and lab results as part of my  medical decision-making.   EKG Interpretation None      MDM   Final diagnoses:  None   Filed Vitals:   11/09/15 0039 11/09/15 0235  BP: 151/102 132/95  Pulse: 76 98  Temp:  97.7 F (36.5 C)  Resp: 18 18   Results for orders placed or performed during the hospital encounter of 11/09/15  CBC with Differential/Platelet  Result Value Ref Range   WBC 6.8 4.0 - 10.5 K/uL   RBC 4.74 4.22 - 5.81 MIL/uL   Hemoglobin 13.3 13.0 - 17.0 g/dL   HCT 10.9 60.4 - 54.0 %   MCV 85.7 78.0 - 100.0 fL   MCH 28.1 26.0 - 34.0 pg   MCHC 32.8 30.0 - 36.0 g/dL   RDW 98.1 19.1 - 47.8 %   Platelets 293 150 - 400 K/uL   Neutrophils Relative % 66 %   Neutro Abs 4.5 1.7 - 7.7 K/uL   Lymphocytes Relative 21 %   Lymphs Abs 1.4 0.7 - 4.0 K/uL   Monocytes Relative 9 %   Monocytes Absolute 0.6 0.1 - 1.0 K/uL   Eosinophils Relative 4 %   Eosinophils Absolute 0.3 0.0 - 0.7 K/uL   Basophils Relative 0 %   Basophils Absolute 0.0 0.0 - 0.1 K/uL  Basic metabolic panel  Result Value Ref Range   Sodium 138 135 - 145 mmol/L   Potassium 3.5 3.5 - 5.1 mmol/L   Chloride 106 101 - 111 mmol/L   CO2 25 22 - 32 mmol/L   Glucose, Bld 85 65 - 99 mg/dL   BUN 13 6 - 20 mg/dL   Creatinine, Ser 2.95 0.61 - 1.24 mg/dL   Calcium 8.9 8.9 - 62.1 mg/dL   GFR calc non Af Amer >60 >60 mL/min   GFR calc Af Amer >60 >60 mL/min   Anion gap 7 5 - 15  Urinalysis, Routine w reflex microscopic (not at [facility])  Result Value Ref Range   Color, Urine YELLOW YELLOW   APPearance CLEAR CLEAR   Specific Gravity, Urine 1.016 1.005 - 1.030   pH 6.0 5.0 - 8.0   Glucose, UA NEGATIVE NEGATIVE mg/dL   Hgb urine dipstick TRACE (A) NEGATIVE   Bilirubin Urine NEGATIVE NEGATIVE   Ketones, ur NEGATIVE NEGATIVE mg/dL   Protein, ur NEGATIVE NEGATIVE mg/dL   Nitrite NEGATIVE NEGATIVE   Leukocytes, UA NEGATIVE NEGATIVE  Urine microscopic-add on  Result Value Ref Range   Squamous Epithelial / LPF 0-5 (A) NONE SEEN   WBC, UA NONE SEEN 0 - 5  WBC/hpf   RBC / HPF 0-5 0 - 5 RBC/hpf   Bacteria, UA RARE (A) NONE SEEN   No results found.  Medications - No data to display   BP returned to normal in the ED with no intervention.  Recommend DASH diet and close follow up with a family doctor to decide upon medication.  Patient verbalizes understanding and agrees to follow up  I personally performed the services described in this documentation, which was  scribed in my presence. The recorded information has been reviewed and is accurate.      Cy BlamerApril Mckynna Vanloan, MD 11/09/15 [phone]

## 2015-11-10 ENCOUNTER — Encounter: Payer: BLUE CROSS/BLUE SHIELD | Admitting: Medical

## 2015-11-10 ENCOUNTER — Telehealth: Payer: Self-pay | Admitting: Medical

## 2015-11-10 DIAGNOSIS — Z0289 Encounter for other administrative examinations: Secondary | ICD-10-CM

## 2015-11-10 NOTE — Progress Notes (Signed)
This encounter was created in error - please disregard.

## 2015-11-11 ENCOUNTER — Encounter: Payer: Self-pay | Admitting: Medical

## 2015-11-11 NOTE — Telephone Encounter (Signed)
Pt was no show 11/10/15 for new pt appt, pt has not rescheduled, was seen in ER 11/09/15, charge or no charge?

## 2015-11-11 NOTE — Telephone Encounter (Signed)
charge 

## 2015-11-11 NOTE — Telephone Encounter (Signed)
Marked to charge and mailing no show letter °

## 2015-11-12 ENCOUNTER — Ambulatory Visit: Payer: BLUE CROSS/BLUE SHIELD | Admitting: Family Medicine

## 2015-11-27 ENCOUNTER — Emergency Department (HOSPITAL_BASED_OUTPATIENT_CLINIC_OR_DEPARTMENT_OTHER)
Admission: EM | Admit: 2015-11-27 | Discharge: 2015-11-27 | Disposition: A | Discharge status: Home / Self Care | Payer: BLUE CROSS/BLUE SHIELD | Attending: Emergency Medicine | Admitting: Emergency Medicine

## 2015-11-27 ENCOUNTER — Encounter (HOSPITAL_BASED_OUTPATIENT_CLINIC_OR_DEPARTMENT_OTHER): Payer: Self-pay | Admitting: *Deleted

## 2015-11-27 DIAGNOSIS — G5701 Lesion of sciatic nerve, right lower limb: Secondary | ICD-10-CM | POA: Insufficient documentation

## 2015-11-27 DIAGNOSIS — F17210 Nicotine dependence, cigarettes, uncomplicated: Secondary | ICD-10-CM | POA: Diagnosis not present

## 2015-11-27 DIAGNOSIS — M5441 Lumbago with sciatica, right side: Secondary | ICD-10-CM | POA: Insufficient documentation

## 2015-11-27 DIAGNOSIS — Z79899 Other long term (current) drug therapy: Secondary | ICD-10-CM | POA: Insufficient documentation

## 2015-11-27 DIAGNOSIS — I10 Essential (primary) hypertension: Secondary | ICD-10-CM | POA: Insufficient documentation

## 2015-11-27 MED ORDER — KETOROLAC TROMETHAMINE 60 MG/2ML IM SOLN
60.0000 mg | Freq: Once | INTRAMUSCULAR | Status: AC
  Administered 2015-11-27: 60 mg via INTRAMUSCULAR
  Filled 2015-11-27: qty 2

## 2015-11-27 MED ORDER — DIAZEPAM 5 MG PO TABS
5.0000 mg | ORAL_TABLET | Freq: Once | ORAL | Status: DC
  Filled 2015-11-27: qty 1

## 2015-11-27 MED ORDER — OXYCODONE HCL 5 MG PO TABS
5.0000 mg | ORAL_TABLET | Freq: Once | ORAL | Status: DC
  Filled 2015-11-27: qty 1

## 2015-11-27 MED ORDER — ACETAMINOPHEN 500 MG PO TABS
1000.0000 mg | ORAL_TABLET | Freq: Once | ORAL | Status: AC
  Administered 2015-11-27: 1000 mg via ORAL
  Filled 2015-11-27: qty 2

## 2015-11-27 NOTE — ED Notes (Signed)
Pt c/o lower back pain which radiates down right leg x 2 days , HX of same

## 2015-11-27 NOTE — Discharge Instructions (Signed)
Take 4 over the counter ibuprofen tablets 3 times a day or 2 over-the-counter naproxen tablets twice a day for pain. Tylenol 1-2 tabs po q4h prn  Piriformis Syndrome With Rehab Piriformis syndrome is a condition the affects the nervous system in the area of the hip, and is characterized by pain and possibly a loss of feeling in the backside (posterior) thigh that may extend down the entire length of the leg. The symptoms are caused by an increase in pressure on the sciatic nerve by the piriformis muscle, which is on the back of the hip and is responsible for externally rotating the hip. The sciatic nerve and its branches connect to much of the leg. Normally the sciatic nerve runs between the piriformis muscle and other muscles. However, in certain individuals the nerve runs through the muscle, which causes an increase in pressure on the nerve and results in the symptoms of piriformis syndrome. SYMPTOMS   Pain, tingling, numbness, or burning in the back of the thigh that may also extend down the entire leg.  Occasionally, tenderness in the buttock.  Loss of function of the leg.  Pain that worsens when using the piriformis muscle (running, jumping, or stairs).  Pain that increases with prolonged sitting.  Pain that is lessened by lying flat on the back. CAUSES   Piriformis syndrome is the result of an increase in pressure placed on the sciatic nerve. Oftentimes, piriformis syndrome is an overuse injury.  Stress placed on the nerve from a sudden increase in the intensity, frequency, or duration of training.  Compensation of other extremity injuries. RISK INCREASES WITH:  Sports that involve the piriformis muscle (running, walking, or jumping).  You are born with (congenital) a defect in which the sciatic nerve passes through the muscle. PREVENTION  Warm up and stretch properly before activity.  Allow for adequate recovery between workouts.  Maintain physical fitness:  Strength,  flexibility, and endurance.  Cardiovascular fitness. PROGNOSIS  If treated properly, the symptoms of piriformis syndrome usually resolve in 2 to 6 weeks. RELATED COMPLICATIONS   Persistent and possibly permanent pain and numbness in the lower extremity.  Weakness of the extremity that may progress to disability and inability to compete. TREATMENT  The most effective treatment for piriformis syndrome is rest from any activities that aggravate the symptoms. Ice and pain medication may help reduce pain and inflammation. The use of strengthening and stretching exercises may help reduce pain with activity. These exercises may be performed at home or with a therapist. A referral to a therapist may be given for further evaluation and treatment, such as ultrasound. Corticosteroid injections may be given to reduce inflammation that is causing pressure to be placed on the sciatic nerve. If nonsurgical (conservative) treatment is unsuccessful, then surgery may be recommended.  MEDICATION   If pain medication is necessary, then nonsteroidal anti-inflammatory medications, such as aspirin and ibuprofen, or other minor pain relievers, such as acetaminophen, are often recommended.  Do not take pain medication for 7 days before surgery.  Prescription pain relievers may be given if deemed necessary by your caregiver. Use only as directed and only as much as you need.  Corticosteroid injections may be given by your caregiver. These injections should be reserved for the most serious cases, because they may only be given a certain number of times. HEAT AND COLD:   Cold treatment (icing) relieves pain and reduces inflammation. Cold treatment should be applied for 10 to 15 minutes every 2 to 3 hours for  inflammation and pain and immediately after any activity that aggravates your symptoms. Use ice packs or massage the area with a piece of ice (ice massage).  Heat treatment may be used prior to performing the  stretching and strengthening activities prescribed by your caregiver, physical therapist, or athletic trainer. Use a heat pack or soak the injury in warm water. SEEK IMMEDIATE MEDICAL CARE IF:  Treatment seems to offer no benefit, or the condition worsens.  Any medications produce adverse side effects. EXERCISES RANGE OF MOTION (ROM) AND STRETCHING EXERCISES - Piriformis Syndrome These exercises may help you when beginning to rehabilitate your injury. Your symptoms may resolve with or without further involvement from your physician, physical therapist, or athletic trainer. While completing these exercises, remember:   Restoring tissue flexibility helps normal motion to return to the joints. This allows healthier, less painful movement and activity.  An effective stretch should be held for at least 30 seconds.  A stretch should never be painful. You should only feel a gentle lengthening or release in the stretched tissue. STRETCH - Hip Rotators  Lie on your back on a firm surface. Grasp your right / left knee with your right / left hand and your ankle with your opposite hand.  Keeping your hips and shoulders firmly planted, gently pull your right / left knee and rotate your lower leg toward your opposite shoulder until you feel a stretch in your buttocks.  Hold this stretch for __________ seconds. Repeat this stretch __________ times. Complete this stretch __________ times per day. STRETCH - Iliotibial Band  On the floor or bed, lie on your side so your right / left leg is on top. Bend your knee and grab your ankle.  Slowly bring your knee back so that your thigh is in line with your trunk. Keep your heel at your buttocks and gently arch your back so your head, shoulders, and hips line up.  Slowly lower your leg so that your knee approaches the floor/bed until you feel a gentle stretch on the outside of your right / left thigh. If you do not feel a stretch and your knee will not fall  farther, place the heel of your opposite foot on top of your knee and pull your thigh down farther.  Hold this stretch for __________ seconds. Repeat __________ times. Complete __________ times per day. STRENGTHENING EXERCISES - Piriformis Syndrome  These are some of the caregiver again or until your symptoms are resolved. Remember:   Strong muscles with good endurance tolerate stress better.  Do the exercises as initially prescribed by your caregiver. Progress slowly with each exercise, gradually increasing the number of repetitions and weight used under their guidance. STRENGTH - Hip Abductors, Straight Leg Raises Be aware of your form throughout the entire exercise so that you exercise the correct muscles. Sloppy form means that you are not strengthening the correct muscles.  Lie on your side so that your head, shoulders, knee, and hip line up. You may bend your lower knee to help maintain your balance. Your right / left leg should be on top.  Roll your hips slightly forward, so that your hips are stacked directly over each other and your right / left knee is facing forward.  Lift your top leg up 4-6 inches, leading with your heel. Be sure that your foot does not drift forward or that your knee does not roll toward the ceiling.  Hold this position for __________ seconds. You should feel the muscles in your  outer hip lifting (you may not notice this until your leg begins to tire).  Slowly lower your leg to the starting position. Allow the muscles to fully relax before beginning the next repetition. Repeat __________ times. Complete this exercise __________ times per day.  STRENGTH - Hip Abductors, Quadruped  On a firm, lightly padded surface, position yourself on your hands and knees. Your hands should be directly below your shoulders and your knees should be directly below your hips.  Keeping your right / left knee bent, lift your leg out to the side. Keep your legs level and in line  with your shoulders.  Position yourself on your hands and knees.  Hold for __________ seconds.  Keeping your trunk steady and your hips level, slowly lower your leg to the starting position. Repeat __________ times. Complete this exercise __________ times per day.  STRENGTH - Hip Abductors, Standing  Tie one end of a rubber exercise band/tubing to a secure surface (table, pole) and tie a loop at the other end.  Place the loop around your right / left ankle. Keeping your ankle with the band directly opposite of the secured end, step away until there is tension in the tube/band.  Hold onto a chair as needed for balance.  Keeping your back upright, your shoulders over your hips, and your toes pointing forward, lift your right / left leg out to your side. Be sure to lift your leg with your hip muscles. Do not "throw" your leg or tip your body to lift your leg.  Slowly and with control, return to the starting position. Repeat exercise __________ times. Complete this exercise __________ times per day.    This information is not intended to replace advice given to you by your health care provider. Make sure you discuss any questions you have with your health care provider.   Document Released: 05/23/2005 Document Revised: 10/07/2014 Document Reviewed: 09/04/2008 Elsevier Interactive Patient Education [employer]

## 2015-11-27 NOTE — ED Provider Notes (Signed)
CSN: [id number]     Arrival date & time 11/27/15  1539 History   First MD Initiated Contact with Patient 11/27/15 1548     Chief Complaint  Patient presents with  . Back Pain     (Consider location/radiation/quality/duration/timing/severity/associated sxs/prior Treatment) Patient is a 35 y.o. male presenting with back pain. The history is provided by the patient.  Back Pain Location:  Lumbar spine Quality:  Stabbing Radiates to:  R knee Pain severity:  Severe Onset quality:  Sudden Duration:  2 days Timing:  Constant Progression:  Worsening Chronicity:  New Relieved by:  Nothing Worsened by:  Palpation, movement and standing Ineffective treatments:  None tried Associated symptoms: leg pain   Associated symptoms: no abdominal pain, no chest pain, no fever and no headaches    35 yo M with R sided low back pain.  Denies injury, got out of bed two days ago with pain that starts in his buttock and radiates down the leg.  Denies loss of bowel, bladder, perirectal sensation.  Denies leg weakness.  Worse with movement, palpation.   Past Medical History  Diagnosis Date  . Hypertension   . Back pain    Past Surgical History  Procedure Laterality Date  . Cholecystectomy     History reviewed. No pertinent family history. Social History  Substance Use Topics  . Smoking status: Current Some Day Smoker    Types: Cigars  . Smokeless tobacco: Never Used  . Alcohol Use: Yes     Comment: rare    Review of Systems  Constitutional: Negative for fever and chills.  HENT: Negative for congestion and facial swelling.   Eyes: Negative for discharge and visual disturbance.  Respiratory: Negative for shortness of breath.   Cardiovascular: Negative for chest pain and palpitations.  Gastrointestinal: Negative for vomiting, abdominal pain and diarrhea.  Musculoskeletal: Positive for myalgias, back pain and arthralgias.  Skin: Negative for color change and rash.  Neurological: Negative for  tremors, syncope and headaches.  Psychiatric/Behavioral: Negative for confusion and dysphoric mood.      Allergies  Review of patient's allergies indicates no known allergies.  Home Medications   Prior to Admission medications   Medication Sig Start Date End Date Taking? Authorizing Provider  propranolol (INDERAL) 40 MG tablet Take 40 mg by mouth 2 (two) times daily.   Yes Historical Provider, MD   BP 157/97 mmHg  Pulse 98  Temp(Src) 98.2 F (36.8 C)  Resp 20  Ht 6\' 4"  (1.93 m)  Wt 323 lb (146.512 kg)  BMI 39.33 kg/m2  SpO2 100% Physical Exam  Constitutional: He is oriented to person, place, and time. He appears well-developed and well-nourished.  HENT:  Head: Normocephalic and atraumatic.  Eyes: EOM are normal. Pupils are equal, round, and reactive to light.  Neck: Normal range of motion. Neck supple. No JVD present.  Cardiovascular: Normal rate and regular rhythm.  Exam reveals no gallop and no friction rub.   No murmur heard. Pulmonary/Chest: No respiratory distress. He has no wheezes.  Abdominal: He exhibits no distension. There is no tenderness. There is no rebound and no guarding.  Musculoskeletal: Normal range of motion. He exhibits tenderness (tender palpation about the right or form is muscle belly. Pulse motor and sensation is intact distally.).  Neurological: He is alert and oriented to person, place, and time.  Skin: No rash noted. No pallor.  Psychiatric: He has a normal mood and affect. His behavior is normal.  Nursing note and vitals reviewed.  ED Course  Procedures (including critical care time) Labs Review Labs Reviewed - No data to display  Imaging Review No results found. I have personally reviewed and evaluated these images and lab results as part of my medical decision-making.   EKG Interpretation None      MDM   Final diagnoses:  Acute right-sided low back pain with right-sided sciatica  Piriformis syndrome of right side    35 yo M  with R sided low back pain, no red flags, able to ambulate.  Treat pain here.  Nsiads, pcp follow up.   4:19 PM:  I have discussed the diagnosis/risks/treatment options with the patient and family and believe the pt to be eligible for discharge home to follow-up with PCP. We also discussed returning to the ED immediately if new or worsening sx occur. We discussed the sx which are most concerning (e.g., sudden worsening pain, fever, inability to tolerate by mouth) that necessitate immediate return. Medications administered to the patient during their visit and any new prescriptions provided to the patient are listed below.  Medications given during this visit Medications  acetaminophen (TYLENOL) tablet 1,000 mg (not administered)  ketorolac (TORADOL) injection 60 mg (not administered)  oxyCODONE (Oxy IR/ROXICODONE) immediate release tablet 5 mg (not administered)  diazepam (VALIUM) tablet 5 mg (not administered)    New Prescriptions   No medications on file    The patient appears reasonably screen and/or stabilized for discharge and I doubt any other medical condition or other [facility] requiring further screening, evaluation, or treatment in the ED at this time prior to discharge.         Melene Planan Lorrie Strauch, DO 11/27/15 1619

## 2015-12-13 ENCOUNTER — Encounter (HOSPITAL_BASED_OUTPATIENT_CLINIC_OR_DEPARTMENT_OTHER): Payer: Self-pay | Admitting: Emergency Medicine

## 2015-12-13 ENCOUNTER — Emergency Department (HOSPITAL_BASED_OUTPATIENT_CLINIC_OR_DEPARTMENT_OTHER)
Admission: EM | Admit: 2015-12-13 | Discharge: 2015-12-13 | Disposition: A | Discharge status: Home / Self Care | Payer: BLUE CROSS/BLUE SHIELD | Attending: Emergency Medicine | Admitting: Emergency Medicine

## 2015-12-13 DIAGNOSIS — R21 Rash and other nonspecific skin eruption: Secondary | ICD-10-CM | POA: Diagnosis present

## 2015-12-13 DIAGNOSIS — L738 Other specified follicular disorders: Secondary | ICD-10-CM | POA: Insufficient documentation

## 2015-12-13 DIAGNOSIS — F17210 Nicotine dependence, cigarettes, uncomplicated: Secondary | ICD-10-CM | POA: Insufficient documentation

## 2015-12-13 DIAGNOSIS — I10 Essential (primary) hypertension: Secondary | ICD-10-CM | POA: Insufficient documentation

## 2015-12-13 NOTE — ED Notes (Signed)
Pt in c/o rash to face, states only new med is K+ prescribed by PMD. Ambulatory in NAD.

## 2015-12-13 NOTE — ED Provider Notes (Signed)
CSN: [id number]     Arrival date & time 12/13/15  2250 History  By signing my name below, I, Doreatha Martinva Mathews, attest that this documentation has been prepared under the direction and in the presence of Odus Clasby, MD. Electronically Signed: Doreatha MartinEva Mathews, ED Scribe. 12/13/2015. 11:19 PM.     Chief Complaint  Patient presents with  . Rash   Patient is a 35 y.o. male presenting with rash. The history is provided by the patient. No language interpreter was used.  Rash Location:  Face and head/neck Head/neck rash location:  Head Facial rash location:  Face Quality: itchiness and redness   Severity:  Mild Onset quality:  Gradual Duration:  1 day Timing:  Constant Progression:  Spreading Chronicity:  New Context: not animal contact, not food, not hot tub use, not insect bite/sting, not medications, not new detergent/soap, not plant contact and not sick contacts   Relieved by:  Nothing Worsened by:  Nothing tried Ineffective treatments:  Antihistamines Associated symptoms: no fever     HPI Comments: Derrick Holden is a 35 y.o. male who presents to the Emergency Department complaining of a diffuse pruritic rash to the face and neck onset yesterday. Pt states he took Benadryl and applied alcohol to his face with no relief. No new soaps, lotions, detergents, foods, animals, medications, pool exposure, plant exposure, lake exposure, perfumes or cologne. No h/o of similar symptoms. No worsening or alleviating factors noted. He denies fever.   Past Medical History  Diagnosis Date  . Hypertension   . Back pain    Past Surgical History  Procedure Laterality Date  . Cholecystectomy     History reviewed. No pertinent family history. Social History  Substance Use Topics  . Smoking status: Current Some Day Smoker    Types: Cigars  . Smokeless tobacco: Never Used  . Alcohol Use: Yes     Comment: rare    Review of Systems  Constitutional: Negative for fever.  Skin: Positive for rash.  All  other systems reviewed and are negative.  Allergies  Review of patient's allergies indicates no known allergies.  Home Medications   Prior to Admission medications   Medication Sig Start Date End Date Taking? Authorizing Provider  propranolol (INDERAL) 40 MG tablet Take 40 mg by mouth 2 (two) times daily.    Historical Provider, MD   BP 162/113 mmHg  Pulse 98  Temp(Src) 98.8 F (37.1 C) (Oral)  Resp 18  Ht 6\' 4"  (1.93 m)  Wt 328 lb (148.78 kg)  BMI 39.94 kg/m2  SpO2 96% Physical Exam  Constitutional: He is oriented to person, place, and time. He appears well-developed and well-nourished. No distress.  HENT:  Head: Normocephalic and atraumatic.  Mouth/Throat: Oropharynx is clear and moist. No oropharyngeal exudate.  No swelling of the lips, tongue or uvula.   Eyes: Conjunctivae and EOM are normal. Pupils are equal, round, and reactive to light.  Neck: Normal range of motion. Neck supple. No JVD present. No tracheal deviation present.  No bruits.   Cardiovascular: Normal rate, regular rhythm and normal heart sounds.  Exam reveals no gallop and no friction rub.   No murmur heard. RRR.   Pulmonary/Chest: Effort normal and breath sounds normal. No stridor. No respiratory distress. He has no wheezes. He has no rales.  Lungs CTA bilaterally.   Abdominal: Soft. Bowel sounds are normal. He exhibits no distension. There is no tenderness. There is no rebound and no guarding.  Musculoskeletal: Normal range of motion.  Lymphadenopathy:    He has no cervical adenopathy.  Neurological: He is alert and oriented to person, place, and time. He has normal reflexes.  Skin: Skin is warm and dry. No erythema.  Papules, not confluent on the cheeks and forehead with whiteheads that spares the scalp. Some papules to the left upper arm. Spares the back.   Psychiatric: He has a normal mood and affect.  Nursing note and vitals reviewed.   ED Course  Procedures (including critical care  time) DIAGNOSTIC STUDIES: Oxygen Saturation is 96% on RA, adequate by my interpretation.    COORDINATION OF CARE: 11:17 PM Discussed treatment plan with pt at bedside which includes symptomatic therapy and pt agreed to plan.    MDM   Final diagnoses:  None   White heads:   Advised pt to use gentle exfoliation with gentle soaps like [place] or Neutrogena followed by [employer]. F/u with family doctor.   I personally performed the services described in this documentation, which was scribed in my presence. The recorded information has been reviewed and is accurate.     Cy Blamer, MD 12/13/15 [phone]

## 2015-12-13 NOTE — ED Notes (Signed)
Care assumed at time of d/c. Pt seen by EDP prior to RN assessment, see MD notes, orders received to d/c.

## 2016-07-02 ENCOUNTER — Emergency Department (HOSPITAL_BASED_OUTPATIENT_CLINIC_OR_DEPARTMENT_OTHER)
Admission: EM | Admit: 2016-07-02 | Discharge: 2016-07-02 | Disposition: A | Discharge status: Home / Self Care | Payer: BLUE CROSS/BLUE SHIELD | Attending: Emergency Medicine | Admitting: Emergency Medicine

## 2016-07-02 ENCOUNTER — Encounter (HOSPITAL_BASED_OUTPATIENT_CLINIC_OR_DEPARTMENT_OTHER): Payer: Self-pay

## 2016-07-02 DIAGNOSIS — Y939 Activity, unspecified: Secondary | ICD-10-CM | POA: Insufficient documentation

## 2016-07-02 DIAGNOSIS — Z79899 Other long term (current) drug therapy: Secondary | ICD-10-CM | POA: Diagnosis not present

## 2016-07-02 DIAGNOSIS — Y929 Unspecified place or not applicable: Secondary | ICD-10-CM | POA: Insufficient documentation

## 2016-07-02 DIAGNOSIS — S3992XA Unspecified injury of lower back, initial encounter: Secondary | ICD-10-CM | POA: Diagnosis present

## 2016-07-02 DIAGNOSIS — W010XXA Fall on same level from slipping, tripping and stumbling without subsequent striking against object, initial encounter: Secondary | ICD-10-CM | POA: Diagnosis not present

## 2016-07-02 DIAGNOSIS — I10 Essential (primary) hypertension: Secondary | ICD-10-CM | POA: Insufficient documentation

## 2016-07-02 DIAGNOSIS — F17290 Nicotine dependence, other tobacco product, uncomplicated: Secondary | ICD-10-CM | POA: Insufficient documentation

## 2016-07-02 DIAGNOSIS — M5441 Lumbago with sciatica, right side: Secondary | ICD-10-CM | POA: Diagnosis not present

## 2016-07-02 DIAGNOSIS — Y999 Unspecified external cause status: Secondary | ICD-10-CM | POA: Diagnosis not present

## 2016-07-02 MED ORDER — CYCLOBENZAPRINE HCL 10 MG PO TABS: 10.0000 mg | ORAL_TABLET | Freq: Two times a day (BID) | ORAL | 0 refills | Status: DC | PRN

## 2016-07-02 NOTE — ED Provider Notes (Signed)
[facility]   CSN: [id number] Arrival date & time: 07/02/16  1930   By signing my name below, I, Teofilo PodMatthew P. Jamison, attest that this documentation has been prepared under the direction and in the presence of Newell RubbermaidJeffrey Shyhiem Beeney, PA-C. Electronically Signed: Teofilo PodMatthew P. Jamison, ED Scribe. 07/02/2016. 8:21 PM.   History   Chief Complaint Chief Complaint  Patient presents with  . Fall    The history is provided by the patient. No language interpreter was used.   HPI Comments:  Derrick Ricksnthony Gieselman is a 36 y.o. male who presents to the Emergency Department complaining of worsening lower back pain x 1 day. Pt reports that 9 days ago he slipped on the ice and was able to catch himself with his hands, and attributes the pain to this fall though the pain did not start until yesterday. Pt reports that yesterday at work he first felt the pain shooting down his right leg, and states that the pain is now in his lower back radiating to his right leg and right shoulder. Pt is ambulatory. Pt has taken tylenol with no relief. Pt denies IV drug use. Pt denies numbness, tingling, fever, urinary symptoms, abdominal pain.   Past Medical History:  Diagnosis Date  . Back pain   . Hypertension     Patient Active Problem List   Diagnosis Date Noted  . HEMORRHOIDS, INTERNAL, WITH BLEEDING 11/17/2009  . HYPERTENSION 08/17/2009  . BACK PAIN, LUMBAR, CHRONIC 08/17/2009    Past Surgical History:  Procedure Laterality Date  . CHOLECYSTECTOMY         Home Medications    Prior to Admission medications   Medication Sig Start Date End Date Taking? Authorizing Provider  losartan-hydrochlorothiazide (HYZAAR) 50-12.5 MG tablet Take 1 tablet by mouth daily.   Yes Historical Provider, MD  cyclobenzaprine (FLEXERIL) 10 MG tablet Take 1 tablet (10 mg total) by mouth 2 (two) times daily as needed for muscle spasms. 07/02/16   Eyvonne MechanicJeffrey Tomicka Lover, PA-C  propranolol (INDERAL) 40 MG tablet Take 40  mg by mouth 2 (two) times daily.    Historical Provider, MD    Family History No family history on file.  Social History Social History  Substance Use Topics  . Smoking status: Current Some Day Smoker    Types: Cigars  . Smokeless tobacco: Never Used  . Alcohol use Yes     Comment: rare     Allergies   Patient has no known allergies.   Review of Systems Review of Systems 10 Systems reviewed and are negative for acute change except as noted in the HPI.   Physical Exam Updated Vital Signs BP 155/88 (BP Location: Left Arm)   Pulse 84   Temp 98.1 F (36.7 C) (Oral)   Resp 19   Ht 6\' 4"  (1.93 m)   Wt (!) 147.4 kg   SpO2 100%   BMI 39.56 kg/m   Physical Exam  Constitutional: He appears well-developed and well-nourished. No distress.  HENT:  Head: Normocephalic and atraumatic.  Eyes: Conjunctivae are normal.  Cardiovascular: Normal rate.   Pulmonary/Chest: Effort normal.  Abdominal: He exhibits no distension.  Musculoskeletal:  No CT or L-spine tenderness. Patient has tenderness to palpation of the right lateral thoracic musculature extending down to the right lateral lumbar region. No signs of trauma or deformities. Distal sensation strength and motor function is intact. Straight leg positive right side. Patient ambulatory thousand significant difficulty  Neurological: He is alert.  Skin: Skin is warm  and dry.  Psychiatric: He has a normal mood and affect.  Nursing note and vitals reviewed.    ED Treatments / Results  DIAGNOSTIC STUDIES:  Oxygen Saturation is 100% on RA, normal by my interpretation.    COORDINATION OF CARE:  8:20 PM Will prescribe a muscle relaxer and will refer to sports medicine. Discussed treatment plan with pt at bedside and pt agreed to plan.   Labs (all labs ordered are listed, but only abnormal results are displayed) Labs Reviewed - No data to display  EKG  EKG Interpretation None       Radiology No results  found.  Procedures Procedures (including critical care time)  Medications Ordered in ED Medications - No data to display   Initial Impression / Assessment and Plan / ED Course  I have reviewed the triage vital signs and the nursing notes.  Pertinent labs & imaging results that were available during my care of the patient were reviewed by me and considered in my medical decision making (see chart for details).    Labs:   Imaging:   Consults:   Therapeutics:   Discharge Meds:   Assessment/Plan:   36 year old male with uncomplicated back pain. Patient will be discharged home with symptomatic care instructions, follow-up information, strict return precautions. Patient verbalized understanding and agreement to today's plan had no further questions or concerns  Final Clinical Impressions(s) / ED Diagnoses   Final diagnoses:  Acute bilateral low back pain with right-sided sciatica    New Prescriptions Discharge Medication List as of 07/02/2016  8:53 PM    START taking these medications   Details  cyclobenzaprine (FLEXERIL) 10 MG tablet Take 1 tablet (10 mg total) by mouth 2 (two) times daily as needed for muscle spasms., Starting Sat 07/02/2016, Print        I personally performed the services described in this documentation, which was scribed in my presence. The recorded information has been reviewed and is accurate.    Eyvonne Mechanic, PA-C 07/03/16 0118    Canary Brim Tegeler, MD 07/03/16 [phone]

## 2016-07-02 NOTE — ED Triage Notes (Signed)
Pt reports fall last week on snow. Sts lower back pain that radiates down left leg and up to his shoulders. Pt ambulatory triage.

## 2016-07-02 NOTE — Discharge Instructions (Signed)
Please read attached information. If you experience any new or worsening signs or symptoms please return to the emergency room for evaluation. Please follow-up with your primary care provider or specialist as discussed. Please use medication prescribed only as directed and discontinue taking if you have any concerning signs or symptoms.   °

## 2016-07-11 ENCOUNTER — Encounter (HOSPITAL_BASED_OUTPATIENT_CLINIC_OR_DEPARTMENT_OTHER): Payer: Self-pay

## 2016-07-11 ENCOUNTER — Emergency Department (HOSPITAL_BASED_OUTPATIENT_CLINIC_OR_DEPARTMENT_OTHER): Payer: BLUE CROSS/BLUE SHIELD

## 2016-07-11 ENCOUNTER — Emergency Department (HOSPITAL_BASED_OUTPATIENT_CLINIC_OR_DEPARTMENT_OTHER)
Admission: EM | Admit: 2016-07-11 | Discharge: 2016-07-11 | Disposition: A | Discharge status: Home / Self Care | Payer: BLUE CROSS/BLUE SHIELD | Attending: Emergency Medicine | Admitting: Emergency Medicine

## 2016-07-11 DIAGNOSIS — M545 Low back pain, unspecified: Secondary | ICD-10-CM

## 2016-07-11 DIAGNOSIS — I10 Essential (primary) hypertension: Secondary | ICD-10-CM | POA: Insufficient documentation

## 2016-07-11 DIAGNOSIS — Z79899 Other long term (current) drug therapy: Secondary | ICD-10-CM | POA: Insufficient documentation

## 2016-07-11 DIAGNOSIS — R1031 Right lower quadrant pain: Secondary | ICD-10-CM | POA: Insufficient documentation

## 2016-07-11 DIAGNOSIS — F17290 Nicotine dependence, other tobacco product, uncomplicated: Secondary | ICD-10-CM | POA: Diagnosis not present

## 2016-07-11 HISTORY — DX: Other chronic pain: G89.29

## 2016-07-11 HISTORY — DX: Dorsalgia, unspecified: M54.9

## 2016-07-11 LAB — CBC WITH DIFFERENTIAL/PLATELET
Basophils Absolute: 0 10*3/uL (ref 0.0–0.1)
Basophils Relative: 0 %
Eosinophils Absolute: 0.3 10*3/uL (ref 0.0–0.7)
Eosinophils Relative: 2 %
HCT: 44.2 % (ref 39.0–52.0)
HEMOGLOBIN: 14.6 g/dL (ref 13.0–17.0)
LYMPHS ABS: 2 10*3/uL (ref 0.7–4.0)
LYMPHS PCT: 16 %
MCH: 28.5 pg (ref 26.0–34.0)
MCHC: 33 g/dL (ref 30.0–36.0)
MCV: 86.3 fL (ref 78.0–100.0)
MONO ABS: 1 10*3/uL (ref 0.1–1.0)
MONOS PCT: 8 %
NEUTROS ABS: 9 10*3/uL — AB (ref 1.7–7.7)
Neutrophils Relative %: 74 %
Platelets: 303 10*3/uL (ref 150–400)
RBC: 5.12 MIL/uL (ref 4.22–5.81)
RDW: 13.9 % (ref 11.5–15.5)
WBC: 12.4 10*3/uL — ABNORMAL HIGH (ref 4.0–10.5)

## 2016-07-11 LAB — BASIC METABOLIC PANEL
Anion gap: 9 (ref 5–15)
BUN: 19 mg/dL (ref 6–20)
CALCIUM: 8.9 mg/dL (ref 8.9–10.3)
CHLORIDE: 102 mmol/L (ref 101–111)
CO2: 27 mmol/L (ref 22–32)
CREATININE: 1.23 mg/dL (ref 0.61–1.24)
GFR calc Af Amer: >60 mL/min (ref >60)
GFR calc non Af Amer: >60 mL/min (ref >60)
GLUCOSE: 119 mg/dL — AB (ref 65–99)
Potassium: 3.4 mmol/L — ABNORMAL LOW (ref 3.5–5.1)
Sodium: 138 mmol/L (ref 135–145)

## 2016-07-11 LAB — URINALYSIS, MICROSCOPIC (REFLEX)

## 2016-07-11 LAB — URINALYSIS, ROUTINE W REFLEX MICROSCOPIC
BILIRUBIN URINE: NEGATIVE
Glucose, UA: NEGATIVE mg/dL
KETONES UR: NEGATIVE mg/dL
Leukocytes, UA: NEGATIVE
NITRITE: NEGATIVE
PH: 6.5 (ref 5.0–8.0)
PROTEIN: 30 mg/dL — AB
Specific Gravity, Urine: 1.031 — ABNORMAL HIGH (ref 1.005–1.030)

## 2016-07-11 MED ORDER — MORPHINE SULFATE (PF) 4 MG/ML IV SOLN
4.0000 mg | Freq: Once | INTRAVENOUS | Status: AC
  Administered 2016-07-11: 4 mg via INTRAVENOUS
  Filled 2016-07-11: qty 1

## 2016-07-11 MED ORDER — DIAZEPAM 5 MG/ML IJ SOLN
5.0000 mg | Freq: Once | INTRAMUSCULAR | Status: AC
  Administered 2016-07-11: 5 mg via INTRAVENOUS
  Filled 2016-07-11: qty 2

## 2016-07-11 MED ORDER — KETOROLAC TROMETHAMINE 30 MG/ML IJ SOLN
30.0000 mg | Freq: Once | INTRAMUSCULAR | Status: AC
  Administered 2016-07-11: 30 mg via INTRAVENOUS
  Filled 2016-07-11: qty 1

## 2016-07-11 MED ORDER — SODIUM CHLORIDE 0.9 % IV BOLUS (SEPSIS)
1000.0000 mL | Freq: Once | INTRAVENOUS | Status: AC
  Administered 2016-07-11: 1000 mL via INTRAVENOUS

## 2016-07-11 MED ORDER — OXYCODONE-ACETAMINOPHEN 5-325 MG PO TABS
1.0000 | ORAL_TABLET | ORAL | Status: DC | PRN
  Administered 2016-07-11: 1 via ORAL
  Filled 2016-07-11: qty 1

## 2016-07-11 MED ORDER — IBUPROFEN 800 MG PO TABS: 800.0000 mg | ORAL_TABLET | Freq: Three times a day (TID) | ORAL | 0 refills | Status: DC

## 2016-07-11 MED ORDER — TRAMADOL HCL 50 MG PO TABS: 50.0000 mg | ORAL_TABLET | Freq: Four times a day (QID) | ORAL | 0 refills | Status: DC | PRN

## 2016-07-11 NOTE — ED Notes (Signed)
Patient transported to CT 

## 2016-07-11 NOTE — ED Triage Notes (Signed)
C/o pain to right flank and lower back-c/o decreased UO and numbness/tingling to both legs-seen here with back pain with near fall-NAD-steady gait

## 2016-07-11 NOTE — ED Notes (Signed)
Pt now c/o abd pain  md aware

## 2016-07-11 NOTE — Discharge Instructions (Signed)
Take motrin for pain.   Take tramadol for severe pain.   Continue flexeril as prescribed previously  See your doctor  Return to ER if you have worse flank pain, testicular pain, vomiting, trouble urinating.

## 2016-07-11 NOTE — ED Notes (Signed)
C/o rt flank pain x 2 weeks,w diff w urination,  Now radiating into groin

## 2016-07-11 NOTE — ED Provider Notes (Signed)
[facility]   CSN: [id number] Arrival date & time: 07/11/16  1508   By signing my name below, I, Clovis PuAvnee Patel, attest that this documentation has been prepared under the direction and in the presence of Charlynne Panderavid Hsienta Ziza Hastings, MD  Electronically Signed: Clovis PuAvnee Patel, ED Scribe. 07/11/16. 9:30 PM.   History   Chief Complaint Chief Complaint  Patient presents with  . Flank Pain   The history is provided by the patient. No language interpreter was used.   HPI Comments:  Derrick Holden is a 36 y.o. male who presents to the Emergency Department complaining of persistent lower back pain onset 2 weeks. Pt also reports intermittent groin pain. Pt states he slipped on ice several weeks ago but was able to catch himself. Pt denies nausea, vomiting, pain with urination, fevers, hx of kidney stones and any other associated symptoms. No known allergies noted. Patient was seen in the ED about a week ago and was prescribed muscle relaxants with no relief.   Past Medical History:  Diagnosis Date  . Back pain   . Hypertension     Patient Active Problem List   Diagnosis Date Noted  . HEMORRHOIDS, INTERNAL, WITH BLEEDING 11/17/2009  . HYPERTENSION 08/17/2009  . BACK PAIN, LUMBAR, CHRONIC 08/17/2009    Past Surgical History:  Procedure Laterality Date  . CHOLECYSTECTOMY         Home Medications    Prior to Admission medications   Medication Sig Start Date End Date Taking? Authorizing Provider  cyclobenzaprine (FLEXERIL) 10 MG tablet Take 1 tablet (10 mg total) by mouth 2 (two) times daily as needed for muscle spasms. 07/02/16   Eyvonne MechanicJeffrey Hedges, PA-C  losartan-hydrochlorothiazide (HYZAAR) 50-12.5 MG tablet Take 1 tablet by mouth daily.    Historical Provider, MD  propranolol (INDERAL) 40 MG tablet Take 40 mg by mouth 2 (two) times daily.    Historical Provider, MD    Family History No family history on file.  Social History Social History  Substance Use Topics   . Smoking status: Current Some Day Smoker    Types: Cigars  . Smokeless tobacco: Never Used  . Alcohol use Yes     Comment: rare     Allergies   Patient has no known allergies.   Review of Systems Review of Systems  Constitutional: Negative for fever.  Gastrointestinal: Negative for nausea and vomiting.  Genitourinary: Negative for difficulty urinating and testicular pain.       +groin pain   Musculoskeletal: Positive for back pain.     Physical Exam Updated Vital Signs BP 135/97   Pulse 118   Resp 18   Ht 6' (1.829 m)   Wt (!) 336 lb (152.4 kg)   SpO2 99%   BMI 45.57 kg/m   Physical Exam  Constitutional: He is oriented to person, place, and time. He appears well-developed and well-nourished.  HENT:  Head: Normocephalic and atraumatic.  Eyes: EOM are normal.  Neck: Normal range of motion.  Cardiovascular: Normal rate, regular rhythm, normal heart sounds and intact distal pulses.   Pulmonary/Chest: Effort normal and breath sounds normal. No respiratory distress.  Abdominal: Soft. He exhibits no distension. There is no tenderness.  Genitourinary:  Genitourinary Comments: Mild right groin tenderness. No testicular tenderness. Good cremasteric reflex, good lie of the testicle   Musculoskeletal: Normal range of motion. He exhibits tenderness.  Right para lumbar tenderness vs CVA tenderness   Neurological: He is alert and oriented to person, place, and  time.  Skin: Skin is warm and dry.  Psychiatric: He has a normal mood and affect. Judgment normal.  Nursing note and vitals reviewed.  ED Treatments / Results  DIAGNOSTIC STUDIES:  Oxygen Saturation is 99% on RA, normal by my interpretation.    COORDINATION OF CARE:  9:23 PM Discussed treatment plan with pt at bedside and pt agreed to plan.  Labs (all labs ordered are listed, but only abnormal results are displayed) Labs Reviewed  URINALYSIS, ROUTINE W REFLEX MICROSCOPIC - Abnormal; Notable for the following:        Result Value   Specific Gravity, Urine 1.031 (*)    Hgb urine dipstick SMALL (*)    Protein, ur 30 (*)    All other components within normal limits  URINALYSIS, MICROSCOPIC (REFLEX) - Abnormal; Notable for the following:    Bacteria, UA MANY (*)    Squamous Epithelial / LPF 0-5 (*)    All other components within normal limits  CBC WITH DIFFERENTIAL/PLATELET - Abnormal; Notable for the following:    WBC 12.4 (*)    Neutro Abs 9.0 (*)    All other components within normal limits  BASIC METABOLIC PANEL - Abnormal; Notable for the following:    Potassium 3.4 (*)    Glucose, Bld 119 (*)    All other components within normal limits    EKG  EKG Interpretation None       Radiology No results found.  Procedures Procedures (including critical care time)  Medications Ordered in ED Medications  oxyCODONE-acetaminophen (PERCOCET/ROXICET) 5-325 MG per tablet 1 tablet (1 tablet Oral Given 07/11/16 1803)     Initial Impression / Assessment and Plan / ED Course  I have reviewed the triage vital signs and the nursing notes.  Pertinent labs & imaging results that were available during my care of the patient were reviewed by me and considered in my medical decision making (see chart for details).     Derrick Holden is a 36 y.o. male here with R flank pain, groin pain. Likely renal colic vs R paralumbar back pain. Will get labs, UA.   11:27 PM UA showed some blood. CT showed no stone. Pain controlled with pain meds, toradol. Testicles nontender and I doubt torsion. He was seen last year for back pain as well. Will give tramadol for pain. Already has flexeril.     Final Clinical Impressions(s) / ED Diagnoses   Final diagnoses:  None    New Prescriptions New Prescriptions   No medications on file  I personally performed the services described in this documentation, which was scribed in my presence. The recorded information has been reviewed and is accurate.     Charlynne Pander, MD 07/11/16 2329

## 2016-11-25 ENCOUNTER — Encounter (HOSPITAL_BASED_OUTPATIENT_CLINIC_OR_DEPARTMENT_OTHER): Payer: Self-pay | Admitting: Emergency Medicine

## 2016-11-25 ENCOUNTER — Emergency Department (HOSPITAL_BASED_OUTPATIENT_CLINIC_OR_DEPARTMENT_OTHER)
Admission: EM | Admit: 2016-11-25 | Discharge: 2016-11-26 | Disposition: A | Discharge status: Home / Self Care | Payer: BLUE CROSS/BLUE SHIELD | Attending: Emergency Medicine | Admitting: Emergency Medicine

## 2016-11-25 DIAGNOSIS — F17210 Nicotine dependence, cigarettes, uncomplicated: Secondary | ICD-10-CM | POA: Diagnosis not present

## 2016-11-25 DIAGNOSIS — I10 Essential (primary) hypertension: Secondary | ICD-10-CM | POA: Diagnosis not present

## 2016-11-25 DIAGNOSIS — M5431 Sciatica, right side: Secondary | ICD-10-CM | POA: Diagnosis not present

## 2016-11-25 DIAGNOSIS — M545 Low back pain: Secondary | ICD-10-CM | POA: Diagnosis present

## 2016-11-25 NOTE — ED Triage Notes (Signed)
Back pain x2 weeks

## 2016-11-26 MED ORDER — DEXAMETHASONE SODIUM PHOSPHATE 10 MG/ML IJ SOLN
10.0000 mg | Freq: Once | INTRAMUSCULAR | Status: AC
  Administered 2016-11-26: 10 mg via INTRAMUSCULAR
  Filled 2016-11-26: qty 1

## 2016-11-26 MED ORDER — KETOROLAC TROMETHAMINE 60 MG/2ML IM SOLN
60.0000 mg | Freq: Once | INTRAMUSCULAR | Status: AC
  Administered 2016-11-26: 60 mg via INTRAMUSCULAR
  Filled 2016-11-26: qty 2

## 2016-11-26 MED ORDER — METHOCARBAMOL 500 MG PO TABS: 500.0000 mg | ORAL_TABLET | Freq: Two times a day (BID) | ORAL | 0 refills | Status: DC

## 2016-11-26 MED ORDER — TRAMADOL HCL 50 MG PO TABS
50.0000 mg | ORAL_TABLET | Freq: Four times a day (QID) | ORAL | 0 refills | Status: DC | PRN
Start: 2016-11-26 — End: 2017-04-12

## 2016-11-26 MED ORDER — IBUPROFEN 800 MG PO TABS: 800.0000 mg | ORAL_TABLET | Freq: Three times a day (TID) | ORAL | 0 refills | Status: DC

## 2016-11-26 NOTE — ED Provider Notes (Signed)
[facility]   CSN: [id number] Arrival date & time: 11/25/16  2340     History   Chief Complaint Chief Complaint  Patient presents with  . Back Pain    HPI Derrick Holden is a 36 y.o. male.  Patient presents to the ER for evaluation of back pain. Patient reports that 2 weeks ago he got out of bed and felt a sudden pain in his lower back that shot down his legs. Pain has been persistent since it began. It is predominantly on the right side. He reports pain with movement of the right leg but no weakness, numbness or tingling. No change in bowel or bladder function.      Past Medical History:  Diagnosis Date  . Chronic back pain   . Hypertension     Patient Active Problem List   Diagnosis Date Noted  . HEMORRHOIDS, INTERNAL, WITH BLEEDING 11/17/2009  . HYPERTENSION 08/17/2009  . BACK PAIN, LUMBAR, CHRONIC 08/17/2009    Past Surgical History:  Procedure Laterality Date  . CHOLECYSTECTOMY         Home Medications    Prior to Admission medications   Medication Sig Start Date End Date Taking? Authorizing Provider  ibuprofen (ADVIL,MOTRIN) 800 MG tablet Take 1 tablet (800 mg total) by mouth 3 (three) times daily. 11/26/16   Gilda Crease, MD  methocarbamol (ROBAXIN) 500 MG tablet Take 1 tablet (500 mg total) by mouth 2 (two) times daily. 11/26/16   Gilda Crease, MD  traMADol (ULTRAM) 50 MG tablet Take 1 tablet (50 mg total) by mouth every 6 (six) hours as needed. 11/26/16   Gilda Crease, MD    Family History No family history on file.  Social History Social History  Substance Use Topics  . Smoking status: Current Some Day Smoker    Types: Cigars  . Smokeless tobacco: Never Used  . Alcohol use Yes     Comment: rare     Allergies   Patient has no known allergies.   Review of Systems Review of Systems  Musculoskeletal: Positive for back pain.  Neurological: Negative for weakness and numbness.  All  other systems reviewed and are negative.    Physical Exam Updated Vital Signs BP 134/84 (BP Location: Left Arm)   Pulse 97   Temp 98.7 F (37.1 C) (Oral)   Resp 20   Ht 6\' 4"  (1.93 m)   Wt (!) 145.2 kg (320 lb)   SpO2 99%   BMI 38.95 kg/m   Physical Exam  Constitutional: He is oriented to person, place, and time. He appears well-developed and well-nourished. No distress.  HENT:  Head: Normocephalic and atraumatic.  Right Ear: Hearing normal.  Left Ear: Hearing normal.  Nose: Nose normal.  Mouth/Throat: Oropharynx is clear and moist and mucous membranes are normal.  Eyes: Conjunctivae and EOM are normal. Pupils are equal, round, and reactive to light.  Neck: Normal range of motion. Neck supple.  Cardiovascular: Regular rhythm, S1 normal and S2 normal.  Exam reveals no gallop and no friction rub.   No murmur heard. Pulmonary/Chest: Effort normal and breath sounds normal. No respiratory distress. He exhibits no tenderness.  Abdominal: Soft. Normal appearance and bowel sounds are normal. There is no hepatosplenomegaly. There is no tenderness. There is no rebound, no guarding, no tenderness at McBurney's point and negative Murphy's sign. No hernia.  Musculoskeletal: Normal range of motion.  Painful inhibition with range of motion at the hip of both legs.  Neurological: He is alert and oriented to person, place, and time. He has normal strength. No cranial nerve deficit or sensory deficit. Coordination normal. GCS eye subscore is 4. GCS verbal subscore is 5. GCS motor subscore is 6.  Positive straight leg raise on the right. No change in sensation or strength in lower extremities. No foot drop. No saddle anesthesia.  Skin: Skin is warm, dry and intact. No rash noted. No cyanosis.  Psychiatric: He has a normal mood and affect. His speech is normal and behavior is normal. Thought content normal.  Nursing note and vitals reviewed.    ED Treatments / Results  Labs (all labs ordered  are listed, but only abnormal results are displayed) Labs Reviewed - No data to display  EKG  EKG Interpretation None       Radiology No results found.  Procedures Procedures (including critical care time)  Medications Ordered in ED Medications  dexamethasone (DECADRON) injection 10 mg (not administered)  ketorolac (TORADOL) injection 60 mg (not administered)     Initial Impression / Assessment and Plan / ED Course  I have reviewed the triage vital signs and the nursing notes.  Pertinent labs & imaging results that were available during my care of the patient were reviewed by me and considered in my medical decision making (see chart for details).     Patient presents to the ER with musculoskeletal back pain. Examination reveals back tenderness without any associated neurologic findings. Patient's strength, sensation and reflexes were normal. There is no evidence of saddle anesthesia. Patient does not have a foot drop. Patient has not experienced any change in bowel or bladder function. As such, patient did not require any imaging or further studies. Patient was treated with analgesia.  Final Clinical Impressions(s) / ED Diagnoses   Final diagnoses:  Sciatica of right side    New Prescriptions New Prescriptions   IBUPROFEN (ADVIL,MOTRIN) 800 MG TABLET    Take 1 tablet (800 mg total) by mouth 3 (three) times daily.   METHOCARBAMOL (ROBAXIN) 500 MG TABLET    Take 1 tablet (500 mg total) by mouth 2 (two) times daily.   TRAMADOL (ULTRAM) 50 MG TABLET    Take 1 tablet (50 mg total) by mouth every 6 (six) hours as needed.     [name], Shoua Ressler J, MD 11/26/16 [phone]

## 2016-12-25 ENCOUNTER — Emergency Department (HOSPITAL_BASED_OUTPATIENT_CLINIC_OR_DEPARTMENT_OTHER)
Admission: EM | Admit: 2016-12-25 | Discharge: 2016-12-25 | Disposition: A | Discharge status: Home / Self Care | Payer: BLUE CROSS/BLUE SHIELD | Attending: Emergency Medicine | Admitting: Emergency Medicine

## 2016-12-25 ENCOUNTER — Encounter (HOSPITAL_BASED_OUTPATIENT_CLINIC_OR_DEPARTMENT_OTHER): Payer: Self-pay | Admitting: Emergency Medicine

## 2016-12-25 DIAGNOSIS — L03032 Cellulitis of left toe: Secondary | ICD-10-CM | POA: Diagnosis not present

## 2016-12-25 DIAGNOSIS — S99922A Unspecified injury of left foot, initial encounter: Secondary | ICD-10-CM | POA: Diagnosis present

## 2016-12-25 DIAGNOSIS — F17200 Nicotine dependence, unspecified, uncomplicated: Secondary | ICD-10-CM | POA: Insufficient documentation

## 2016-12-25 DIAGNOSIS — Z79899 Other long term (current) drug therapy: Secondary | ICD-10-CM | POA: Insufficient documentation

## 2016-12-25 DIAGNOSIS — Y999 Unspecified external cause status: Secondary | ICD-10-CM | POA: Insufficient documentation

## 2016-12-25 DIAGNOSIS — W228XXA Striking against or struck by other objects, initial encounter: Secondary | ICD-10-CM | POA: Diagnosis not present

## 2016-12-25 DIAGNOSIS — Y929 Unspecified place or not applicable: Secondary | ICD-10-CM | POA: Diagnosis not present

## 2016-12-25 DIAGNOSIS — I10 Essential (primary) hypertension: Secondary | ICD-10-CM | POA: Insufficient documentation

## 2016-12-25 DIAGNOSIS — Y939 Activity, unspecified: Secondary | ICD-10-CM | POA: Diagnosis not present

## 2016-12-25 MED ORDER — AMOXICILLIN-POT CLAVULANATE 875-125 MG PO TABS: 1.0000 | ORAL_TABLET | Freq: Two times a day (BID) | ORAL | 0 refills | Status: DC

## 2016-12-25 NOTE — ED Notes (Signed)
[facility] Pt dressed and ready to go. Denies questions or needs.   Alert, NAD, calm, interactive, resps e/u, speaking in clear complete sentences, no dyspnea noted, skin W&D, VSS, BP elevated, "has not had BP meds today", rates toe pain improved 5/10, (denies: sob, fever, numbness, tingling, nausea, dizziness or visual changes).  Given Rx x1.

## 2016-12-25 NOTE — Discharge Instructions (Signed)
Take the prescribed medication as directed.  Can do warm soaks at home with warm water, epsom salt, etc. Follow-up with your primary care doctor if any acute changes. Return to the ED for new or worsening symptoms.

## 2016-12-25 NOTE — ED Triage Notes (Addendum)
Pt dropped a piece of furniture on L great toe 2 weeks ago. Pt reports ongoing pain and swelling. Pt also has a paronychia to L great toe.

## 2016-12-25 NOTE — ED Provider Notes (Signed)
[facility] Provider Note   CSN: [id number] Arrival date & time: 12/25/16  1714  By signing my name below, I, Derrick Holden, attest that this documentation has been prepared under the direction and in the presence of [name], PA-C. Electronically Signed: Thelma BargeNick Holden, Scribe. 12/25/16. 7:07 PM. History   Chief Complaint Chief Complaint  Patient presents with  . Toe Injury  . Paronychia  The history is provided by the patient. No language interpreter was used.    HPI Comments: Derrick Holden is a 36 y.o. male who presents to the Emergency Department complaining of constant, gradually worsening left-sided great toe pain s/p dropping a piece of furniture that occurred 2 weeks ago.  He has associated swelling to the area and difficulty bending his toe. States toe is now swollen.  No fever, chills.  Past Medical History:  Diagnosis Date  . Chronic back pain   . Hypertension     Patient Active Problem List   Diagnosis Date Noted  . HEMORRHOIDS, INTERNAL, WITH BLEEDING 11/17/2009  . HYPERTENSION 08/17/2009  . BACK PAIN, LUMBAR, CHRONIC 08/17/2009    Past Surgical History:  Procedure Laterality Date  . CHOLECYSTECTOMY         Home Medications    Prior to Admission medications   Medication Sig Start Date End Date Taking? Authorizing Provider  lisinopril (PRINIVIL,ZESTRIL) 40 MG tablet Take 40 mg by mouth daily.   Yes [provider]  ibuprofen (ADVIL,MOTRIN) 800 MG tablet Take 1 tablet (800 mg total) by mouth 3 (three) times daily. 11/26/16   [name], [name], MD  methocarbamol (ROBAXIN) 500 MG tablet Take 1 tablet (500 mg total) by mouth 2 (two) times daily. 11/26/16   [name], [name], MD  traMADol (ULTRAM) 50 MG tablet Take 1 tablet (50 mg total) by mouth every 6 (six) hours as needed. 11/26/16   [name], [name], MD    Family History No family history on file.  Social History Social History  Substance Use Topics  . Smoking  status: Current Some Day Smoker    Types: Cigars  . Smokeless tobacco: Never Used  . Alcohol use Yes     Comment: rare     Allergies   Patient has no known allergies.   Review of Systems Review of Systems  Constitutional: Negative for fever.  Musculoskeletal: Positive for joint swelling.       Great toe pain  All other systems reviewed and are negative.    Physical Exam Updated Vital Signs BP 135/90 (BP Location: Left Arm)   Pulse 90   Temp 98.4 F (36.9 C) (Oral)   Resp 20   Ht 6\' 4"  (1.93 m)   Wt (!) 320 lb (145.2 kg)   SpO2 100%   BMI 38.95 kg/m   Physical Exam  Constitutional: He is oriented to person, place, and time. He appears well-developed and well-nourished.  HENT:  Head: Normocephalic and atraumatic.  Mouth/Throat: Oropharynx is clear and moist.  Eyes: Pupils are equal, round, and reactive to light. Conjunctivae and EOM are normal.  Neck: Normal range of motion.  Cardiovascular: Normal rate, regular rhythm and normal heart sounds.   Pulmonary/Chest: Effort normal and breath sounds normal.  Abdominal: Soft. Bowel sounds are normal.  Musculoskeletal: Normal range of motion.  Paronychia of left great toe, there is some mild erythema and swelling, no streaking up the foot or ankle, DP pulse intact, normal sensation  Neurological: He is alert and oriented to person, place, and time.  Skin: Skin is warm and dry.  Psychiatric: He has a normal mood and affect.  Nursing note and vitals reviewed.    ED Treatments / Results  DIAGNOSTIC STUDIES: Oxygen Saturation is 100% on RA, normal by my interpretation.    COORDINATION OF CARE: 7:07 PM Discussed treatment plan with pt at bedside and pt agreed to plan.  Labs (all labs ordered are listed, but only abnormal results are displayed) Labs Reviewed - No data to display  EKG  EKG Interpretation None       Radiology No results found.  Procedures Drain paronychia Date/Time: 12/25/2016 7:44  PM Performed by: Garlon Hatchet Authorized by: Garlon Hatchet  Consent: Verbal consent obtained. Written consent not obtained. Risks and benefits: risks, benefits and alternatives were discussed Consent given by: patient Patient understanding: patient states understanding of the procedure being performed Required items: required blood products, implants, devices, and special equipment available Patient identity confirmed: verbally with patient Time out: Immediately prior to procedure a "time out" was called to verify the correct patient, procedure, equipment, support staff and site/side marked as required. Preparation: Patient was prepped and draped in the usual sterile fashion. Local anesthesia used: no  Anesthesia: Local anesthesia used: no  Sedation: Patient sedated: no Patient tolerance: Patient tolerated the procedure well with no immediate complications    (including critical care time)  Medications Ordered in ED Medications - No data to display   Initial Impression / Assessment and Plan / ED Course  I have reviewed the triage vital signs and the nursing notes.  Pertinent labs & imaging results that were available during my care of the patient were reviewed by me and considered in my medical decision making (see chart for details).  36 year old male here with left great toe pain. On exam he appears to have a paronychia. Localized infection without evidence of dissemination into the foot or ankle. Paronychia was drained here without acute complications, patient reports relief. Will discharge home on antibiotics, encouraged warm soaks. Can follow-up closely with PCP feeling ongoing issues.  Discussed plan with patient, he acknowledged understanding and agreed with plan of care.  Return precautions given for new or worsening symptoms.  Final Clinical Impressions(s) / ED Diagnoses   Final diagnoses:  Paronychia of great toe of left foot    New Prescriptions New  Prescriptions   AMOXICILLIN-CLAVULANATE (AUGMENTIN) 875-125 MG TABLET    Take 1 tablet by mouth every 12 (twelve) hours.   I personally performed the services described in this documentation, which was scribed in my presence. The recorded information has been reviewed and is accurate.   Garlon Hatchet, PA-C 12/25/16 1948    Benjiman Core, MD 12/25/16 [phone]

## 2017-01-14 ENCOUNTER — Emergency Department (HOSPITAL_BASED_OUTPATIENT_CLINIC_OR_DEPARTMENT_OTHER)
Admission: EM | Admit: 2017-01-14 | Discharge: 2017-01-14 | Disposition: A | Discharge status: Home / Self Care | Payer: BLUE CROSS/BLUE SHIELD | Attending: Physician Assistant | Admitting: Physician Assistant

## 2017-01-14 ENCOUNTER — Encounter (HOSPITAL_BASED_OUTPATIENT_CLINIC_OR_DEPARTMENT_OTHER): Payer: Self-pay | Admitting: Emergency Medicine

## 2017-01-14 ENCOUNTER — Emergency Department (HOSPITAL_BASED_OUTPATIENT_CLINIC_OR_DEPARTMENT_OTHER): Payer: BLUE CROSS/BLUE SHIELD

## 2017-01-14 DIAGNOSIS — F17290 Nicotine dependence, other tobacco product, uncomplicated: Secondary | ICD-10-CM | POA: Insufficient documentation

## 2017-01-14 DIAGNOSIS — M79675 Pain in left toe(s): Secondary | ICD-10-CM | POA: Insufficient documentation

## 2017-01-14 DIAGNOSIS — I10 Essential (primary) hypertension: Secondary | ICD-10-CM | POA: Diagnosis not present

## 2017-01-14 MED ORDER — LIDOCAINE HCL 2 % IJ SOLN
20.0000 mL | Freq: Once | INTRAMUSCULAR | Status: AC
  Administered 2017-01-14: 400 mg via INTRADERMAL
  Filled 2017-01-14: qty 20

## 2017-01-14 MED ORDER — CEPHALEXIN 500 MG PO CAPS: 500.0000 mg | ORAL_CAPSULE | Freq: Four times a day (QID) | ORAL | 0 refills | Status: DC

## 2017-01-14 NOTE — ED Provider Notes (Signed)
[facility]   CSN: [id number] Arrival date & time: 01/14/17  1642     History   Chief Complaint Chief Complaint  Patient presents with  . Toe Injury    HPI Derrick Holden is a 36 y.o. male who presents with left great toe pain. He states that he was seen in the ED on July 22 due to a paronychia of the left great toe after injuring it. Incision and drainage was performed on the toe and he was put on Augmentin. He has been doing warm soaks, using Hydrogen peroxide, and has finished his course of antibiotics. The redness and swelling has improved however now he has noticed that his left toe appears darker and it is still been draining. He has been able to walk without difficulty. No fevers. He is worried because he doesn't want his toe to have to be amputated  HPI  Past Medical History:  Diagnosis Date  . Chronic back pain   . Hypertension     Patient Active Problem List   Diagnosis Date Noted  . HEMORRHOIDS, INTERNAL, WITH BLEEDING 11/17/2009  . HYPERTENSION 08/17/2009  . BACK PAIN, LUMBAR, CHRONIC 08/17/2009    Past Surgical History:  Procedure Laterality Date  . CHOLECYSTECTOMY         Home Medications    Prior to Admission medications   Medication Sig Start Date End Date Taking? Authorizing Provider  amoxicillin-clavulanate (AUGMENTIN) 875-125 MG tablet Take 1 tablet by mouth every 12 (twelve) hours. 12/25/16   Garlon Hatchet, PA-C  ibuprofen (ADVIL,MOTRIN) 800 MG tablet Take 1 tablet (800 mg total) by mouth 3 (three) times daily. 11/26/16   Gilda Crease, MD  lisinopril (PRINIVIL,ZESTRIL) 40 MG tablet Take 40 mg by mouth daily.    [provider]  methocarbamol (ROBAXIN) 500 MG tablet Take 1 tablet (500 mg total) by mouth 2 (two) times daily. 11/26/16   Gilda Crease, MD  traMADol (ULTRAM) 50 MG tablet Take 1 tablet (50 mg total) by mouth every 6 (six) hours as needed. 11/26/16   Gilda Crease, MD     Family History History reviewed. No pertinent family history.  Social History Social History  Substance Use Topics  . Smoking status: Current Some Day Smoker    Types: Cigars  . Smokeless tobacco: Never Used  . Alcohol use Yes     Comment: rare     Allergies   Patient has no known allergies.   Review of Systems Review of Systems  Constitutional: Negative for fever.  Musculoskeletal: Positive for arthralgias. Negative for gait problem.  Skin: Positive for color change and wound.  Neurological: Negative for weakness and numbness.     Physical Exam Updated Vital Signs BP 116/85 (BP Location: Left Arm)   Pulse 98   Temp 97.8 F (36.6 C) (Oral)   Resp 18   Ht 6\' 4"  (1.93 m)   Wt (!) 145.2 kg (320 lb)   SpO2 100%   BMI 38.95 kg/m   Physical Exam  Constitutional: He is oriented to person, place, and time. He appears well-developed and well-nourished. No distress.  HENT:  Head: Normocephalic and atraumatic.  Eyes: Pupils are equal, round, and reactive to light. Conjunctivae are normal. Right eye exhibits no discharge. Left eye exhibits no discharge. No scleral icterus.  Neck: Normal range of motion.  Cardiovascular: Normal rate.   Pulmonary/Chest: Effort normal. No respiratory distress.  Abdominal: He exhibits no distension.  Musculoskeletal:  Left great toe  is hyperpigmented but not red, warm, or swollen. There is drainage from the nail folds when pressure is applied to nail. There is tendernes to palpation over tip of toe. Pain with ROM of toe.  Neurological: He is alert and oriented to person, place, and time.  Skin: Skin is warm and dry.  Psychiatric: He has a normal mood and affect. His behavior is normal.  Nursing note and vitals reviewed.    ED Treatments / Results  Labs (all labs ordered are listed, but only abnormal results are displayed) Labs Reviewed - No data to display  EKG  EKG Interpretation None       Radiology Dg Foot Complete  Left  Result Date: 01/14/2017 CLINICAL DATA:  36 year old male with injury to the left great toe 1 month ago with worsening postero a niche around an nail of the great toe, and persistent toe pain. EXAM: LEFT FOOT - COMPLETE 3+ VIEW COMPARISON:  No priors. FINDINGS: There is no evidence of fracture or dislocation. There is no evidence of arthropathy or other focal bone abnormality. Soft tissues are unremarkable. IMPRESSION: Negative. Electronically Signed   By: Trudie Reedaniel  Entrikin M.D.   On: 01/14/2017 17:24    Procedures Procedures (including critical care time)  Medications Ordered in ED Medications  lidocaine (XYLOCAINE) 2 % (with pres) injection 400 mg (400 mg Intradermal Given 01/14/17 1916)     Initial Impression / Assessment and Plan / ED Course  I have reviewed the triage vital signs and the nursing notes.  Pertinent labs & imaging results that were available during my care of the patient were reviewed by me and considered in my medical decision making (see chart for details).  36 year old male with ongoing pain and drainage from L toe. I&D was performed and there was a scant amount of drainage over lateral nail fold. Discussed wound care. Will rx Keflex and advised f/u with podiatry.  Final Clinical Impressions(s) / ED Diagnoses   Final diagnoses:  Toe pain, left    New Prescriptions New Prescriptions   No medications on file     Derrick Holden, Derrick Nauta Marie, PA-C 01/14/17 2238    [name], Derrick Lyn, MD 01/14/17 2357

## 2017-01-14 NOTE — ED Triage Notes (Signed)
Patient reports that he was here 2 -3 weeks ago for a toe injury. He reports that now his toes is discolored and he is concerned that it is down to the bone. Patient has pus draining out around his nail

## 2017-01-14 NOTE — Discharge Instructions (Signed)
Xray from today was normal Take antibiotics for the next 5 days Continue warm soaks and clean with soap and water Follow up with podiatry if symptoms continue

## 2017-01-14 NOTE — ED Notes (Signed)
ED Provider at bedside. 

## 2017-04-12 ENCOUNTER — Other Ambulatory Visit: Payer: Self-pay

## 2017-04-12 ENCOUNTER — Emergency Department (HOSPITAL_BASED_OUTPATIENT_CLINIC_OR_DEPARTMENT_OTHER)
Admission: EM | Admit: 2017-04-12 | Discharge: 2017-04-12 | Disposition: A | Discharge status: Home / Self Care | Payer: BLUE CROSS/BLUE SHIELD | Attending: Emergency Medicine | Admitting: Emergency Medicine

## 2017-04-12 ENCOUNTER — Encounter (HOSPITAL_BASED_OUTPATIENT_CLINIC_OR_DEPARTMENT_OTHER): Payer: Self-pay

## 2017-04-12 DIAGNOSIS — Z87891 Personal history of nicotine dependence: Secondary | ICD-10-CM | POA: Insufficient documentation

## 2017-04-12 DIAGNOSIS — J0110 Acute frontal sinusitis, unspecified: Secondary | ICD-10-CM

## 2017-04-12 DIAGNOSIS — J069 Acute upper respiratory infection, unspecified: Secondary | ICD-10-CM | POA: Diagnosis not present

## 2017-04-12 DIAGNOSIS — B9789 Other viral agents as the cause of diseases classified elsewhere: Secondary | ICD-10-CM | POA: Insufficient documentation

## 2017-04-12 DIAGNOSIS — Z79899 Other long term (current) drug therapy: Secondary | ICD-10-CM | POA: Diagnosis not present

## 2017-04-12 DIAGNOSIS — R05 Cough: Secondary | ICD-10-CM | POA: Diagnosis present

## 2017-04-12 DIAGNOSIS — I10 Essential (primary) hypertension: Secondary | ICD-10-CM | POA: Insufficient documentation

## 2017-04-12 MED ORDER — DM-GUAIFENESIN ER 30-600 MG PO TB12: 1.0000 | ORAL_TABLET | Freq: Two times a day (BID) | ORAL | 0 refills | Status: DC

## 2017-04-12 NOTE — Discharge Instructions (Signed)
Mucinex is considered safe for for decongestion purposes for patients of hypertension. Take Tylenol or Motrin for body aches and fevers. Drink plenty of fluid, and your symptoms should start getting better in about 5-7 days.

## 2017-04-12 NOTE — ED Provider Notes (Signed)
[facility] EMERGENCY DEPARTMENT Provider Note   CSN: [id number] Arrival date & time: 04/12/17  2035     History   Chief Complaint Chief Complaint  Patient presents with  . Cough    HPI Derrick Holden is a 36 y.o. male.  HPI Patient with history of hypertension comes in with chief complaint of cough. Patient reports that for the past 2 days he has had some sinus congestion, with associated nonproductive cough.  Patient denies any sore throat, chest pain, shortness of breath.  Patient denies any fevers or chills.  Patient is only taken Tylenol, and avoided decongestants because of his history of high blood pressure   Past Medical History:  Diagnosis Date  . Chronic back pain   . Hypertension     Patient Active Problem List   Diagnosis Date Noted  . HEMORRHOIDS, INTERNAL, WITH BLEEDING 11/17/2009  . HYPERTENSION 08/17/2009  . BACK PAIN, LUMBAR, CHRONIC 08/17/2009    Past Surgical History:  Procedure Laterality Date  . CHOLECYSTECTOMY         Home Medications    Prior to Admission medications   Medication Sig Start Date End Date Taking? Authorizing Provider  losartan-hydrochlorothiazide (HYZAAR) 100-25 MG tablet Take 1 tablet daily by mouth.   Yes [provider]  dextromethorphan-guaiFENesin (MUCINEX DM) 30-600 MG 12hr tablet Take 1 tablet 2 (two) times daily by mouth. 04/12/17   Derwood KaplanNanavati, Seena Ritacco, MD    Family History No family history on file.  Social History Social History   Tobacco Use  . Smoking status: Former Games developermoker  . Smokeless tobacco: Never Used  Substance Use Topics  . Alcohol use: Yes    Comment: occ  . Drug use: No     Allergies   Patient has no known allergies.   Review of Systems Review of Systems  Constitutional: Positive for activity change.  HENT: Positive for congestion, sinus pressure, sinus pain and voice change. Negative for sore throat and trouble swallowing.   Respiratory: Positive for cough. Negative  for shortness of breath and wheezing.   Cardiovascular: Negative for chest pain.  Allergic/Immunologic: Negative for immunocompromised state.     Physical Exam Updated Vital Signs BP (!) 154/93 (BP Location: Left Arm)   Pulse (!) 105   Temp 98.4 F (36.9 C) (Oral)   Resp 18   Ht 6\' 4"  (1.93 m)   Wt (!) 145.2 kg (320 lb)   SpO2 98%   BMI 38.95 kg/m   Physical Exam  Constitutional: He is oriented to person, place, and time. He appears well-developed.  HENT:  Head: Atraumatic.  Eyes: EOM are normal.  Bilateral chemosis  Neck: Neck supple.  Cardiovascular: Normal rate.  Pulmonary/Chest: Effort normal. He has no wheezes.  Lymphadenopathy:    He has no cervical adenopathy.  Neurological: He is alert and oriented to person, place, and time.  Skin: Skin is warm.  Nursing note and vitals reviewed.    ED Treatments / Results  Labs (all labs ordered are listed, but only abnormal results are displayed) Labs Reviewed - No data to display  EKG  EKG Interpretation None       Radiology No results found.  Procedures Procedures (including critical care time)  Medications Ordered in ED Medications - No data to display   Initial Impression / Assessment and Plan / ED Course  I have reviewed the triage vital signs and the nursing notes.  Pertinent labs & imaging results that were available during my care of the  patient were reviewed by me and considered in my medical decision making (see chart for details).    Patient comes in with chief complaint of sinus congestion, sinus pain, voice change, nonproductive cough.  Clinically it appears that he has sinusitis and viral URI causing congestion.  We will give him Mucinex which is safer for hypertensive patients.  Final Clinical Impressions(s) / ED Diagnoses   Final diagnoses:  Viral URI with cough  Acute non-recurrent frontal sinusitis    ED Discharge Orders        Ordered    dextromethorphan-guaiFENesin (MUCINEX DM)  [facility]30-600 MG 12hr tablet  2 times daily     04/12/17 2319       Derwood KaplanNanavati, Cecilie Heidel, MD 04/12/17 2345

## 2017-04-12 NOTE — ED Triage Notes (Addendum)
C/o flu like sx x 3 days-states he did get flu shot-NAD-steady gait

## 2017-05-05 ENCOUNTER — Encounter (HOSPITAL_BASED_OUTPATIENT_CLINIC_OR_DEPARTMENT_OTHER): Payer: Self-pay | Admitting: *Deleted

## 2017-05-05 ENCOUNTER — Emergency Department (HOSPITAL_BASED_OUTPATIENT_CLINIC_OR_DEPARTMENT_OTHER): Payer: BLUE CROSS/BLUE SHIELD

## 2017-05-05 ENCOUNTER — Other Ambulatory Visit: Payer: Self-pay

## 2017-05-05 ENCOUNTER — Emergency Department (HOSPITAL_BASED_OUTPATIENT_CLINIC_OR_DEPARTMENT_OTHER)
Admission: EM | Admit: 2017-05-05 | Discharge: 2017-05-05 | Disposition: A | Discharge status: Home / Self Care | Payer: BLUE CROSS/BLUE SHIELD | Attending: Emergency Medicine | Admitting: Emergency Medicine

## 2017-05-05 DIAGNOSIS — Z79899 Other long term (current) drug therapy: Secondary | ICD-10-CM | POA: Diagnosis not present

## 2017-05-05 DIAGNOSIS — J181 Lobar pneumonia, unspecified organism: Secondary | ICD-10-CM | POA: Insufficient documentation

## 2017-05-05 DIAGNOSIS — Z87891 Personal history of nicotine dependence: Secondary | ICD-10-CM | POA: Diagnosis not present

## 2017-05-05 DIAGNOSIS — I10 Essential (primary) hypertension: Secondary | ICD-10-CM | POA: Diagnosis not present

## 2017-05-05 DIAGNOSIS — R05 Cough: Secondary | ICD-10-CM | POA: Diagnosis present

## 2017-05-05 MED ORDER — AMOXICILLIN 500 MG PO CAPS: 500.0000 mg | ORAL_CAPSULE | Freq: Three times a day (TID) | ORAL | 0 refills | Status: DC

## 2017-05-05 MED ORDER — AZITHROMYCIN 250 MG PO TABS: 250.0000 mg | ORAL_TABLET | Freq: Every day | ORAL | 0 refills | Status: DC

## 2017-05-05 MED ORDER — BENZONATATE 100 MG PO CAPS: 100.0000 mg | ORAL_CAPSULE | Freq: Three times a day (TID) | ORAL | 0 refills | Status: DC

## 2017-05-05 NOTE — ED Provider Notes (Signed)
[facility] EMERGENCY DEPARTMENT Provider Note   CSN: [id number] Arrival date & time: 05/05/17  1858     History   Chief Complaint Chief Complaint  Patient presents with  . Cough    HPI Derrick Holden is a 36 y.o. male.  The history is provided by the patient.  Cough  This is a new problem. The current episode started more than 1 week ago. The problem occurs constantly. The problem has not changed since onset.The cough is non-productive. There has been no fever. Pertinent negatives include no chest pain, no chills, no sweats, no rhinorrhea, no sore throat, no myalgias, no shortness of breath and no wheezing. He has tried nothing for the symptoms.   36 year old male who presents with persistent cough for 2 weeks.  States that initially he had a viral respiratory illness with congestion, runny nose, sore throat fevers and chills.  He states that all of his symptoms are now resolved aside from this residual cough, worse at nighttime.  Has not tried any further treatments for this.  Denies any fever, difficulty breathing, nausea or vomiting, myalgias, leg swelling or leg pain, or chest pain.  Past Medical History:  Diagnosis Date  . Chronic back pain   . Hypertension     Patient Active Problem List   Diagnosis Date Noted  . HEMORRHOIDS, INTERNAL, WITH BLEEDING 11/17/2009  . HYPERTENSION 08/17/2009  . BACK PAIN, LUMBAR, CHRONIC 08/17/2009    Past Surgical History:  Procedure Laterality Date  . CHOLECYSTECTOMY         Home Medications    Prior to Admission medications   Medication Sig Start Date End Date Taking? Authorizing Provider  amoxicillin (AMOXIL) 500 MG capsule Take 1 capsule (500 mg total) by mouth 3 (three) times daily. 05/05/17   Lavera GuiseLiu, Adline Kirshenbaum Duo, MD  azithromycin (ZITHROMAX) 250 MG tablet Take 1 tablet (250 mg total) by mouth daily. Take first 2 tablets together, then 1 every day until finished. 05/05/17   Lavera GuiseLiu, Merelyn Klump Duo, MD  benzonatate (TESSALON)  100 MG capsule Take 1 capsule (100 mg total) by mouth every 8 (eight) hours. 05/05/17   Lavera GuiseLiu, Ashely Joshua Duo, MD  dextromethorphan-guaiFENesin [facility](MUCINEX DM) 30-600 MG 12hr tablet Take 1 tablet 2 (two) times daily by mouth. 04/12/17   Derwood KaplanNanavati, Ankit, MD  losartan-hydrochlorothiazide (HYZAAR) 100-25 MG tablet Take 1 tablet daily by mouth.    [provider]    Family History No family history on file.  Social History Social History   Tobacco Use  . Smoking status: Former Games developermoker  . Smokeless tobacco: Never Used  Substance Use Topics  . Alcohol use: Yes    Comment: occ  . Drug use: No     Allergies   Patient has no known allergies.   Review of Systems Review of Systems  Constitutional: Negative for chills.  HENT: Negative for rhinorrhea and sore throat.   Respiratory: Positive for cough. Negative for shortness of breath and wheezing.   Cardiovascular: Negative for chest pain.  Musculoskeletal: Negative for myalgias.  All other systems reviewed and are negative.    Physical Exam Updated Vital Signs BP (!) 145/89   Pulse (!) 114   Temp 98.7 F (37.1 C)   Resp 18   Ht 6\' 4"  (1.93 m)   Wt (!) 145.2 kg (320 lb)   SpO2 100%   BMI 38.95 kg/m   Physical Exam Physical Exam  Nursing note and vitals reviewed. Constitutional: Well developed, well nourished, non-toxic, and in no  acute distress Head: Normocephalic and atraumatic.  Mouth/Throat: Oropharynx is clear and moist.  Neck: Normal range of motion. Neck supple.  Cardiovascular: Normal rate and regular rhythm.   Pulmonary/Chest: Effort normal and breath sounds normal.  Abdominal: Soft. There is no tenderness. There is no rebound and no guarding.  Musculoskeletal: Normal range of motion.  Neurological: Alert, no facial droop, fluent speech, moves all extremities symmetrically Skin: Skin is warm and dry.  Psychiatric: Cooperative   ED Treatments / Results  Labs (all labs ordered are listed, but only abnormal  results are displayed) Labs Reviewed - No data to display  EKG  EKG Interpretation None       Radiology Dg Chest 2 View  Result Date: 05/05/2017 CLINICAL DATA:  Cough and chest congestion for 2 weeks. EXAM: CHEST  2 VIEW COMPARISON:  None. FINDINGS: Patchy ill-defined areas of airspace disease are seen in the left mid and lower lung, suspicious for pneumonia. Right lung is clear. No evidence of pleural effusion. Heart size and mediastinal contours within normal limits. IMPRESSION: Left mid and lower lung airspace disease, suspicious for pneumonia. Recommend clinical correlation; consider followup PA and lateral chest X-ray in 3-4 weeks following trial of antibiotic therapy to ensure resolution. Electronically Signed   By: Myles RosenthalJohn  Stahl M.D.   On: 05/05/2017 19:39    Procedures Procedures (including critical care time)  Medications Ordered in ED Medications - No data to display   Initial Impression / Assessment and Plan / ED Course  I have reviewed the triage vital signs and the nursing notes.  Pertinent labs & imaging results that were available during my care of the patient were reviewed by me and considered in my medical decision making (see chart for details).     With persistent cough after recent viral URI.  Is nontoxic in no acute distress.  States that all of his other URI symptoms including fever resolved and now just with persistent cough.  Chest x-rays visualized, one does show possible infiltrates in the right lower and middle lobe.  Given his persistent cough will treat with course of antibiotics.  He is afebrile, not systemically ill, and breathing comfortably.  I do feel that he is stable for outpatient treatment. Strict return and follow-up instructions reviewed. He expressed understanding of all discharge instructions and felt comfortable with the plan of care.   Final Clinical Impressions(s) / ED Diagnoses   Final diagnoses:  Lobar pneumonia [facility](HCC)    ED  Discharge Orders        Ordered    benzonatate (TESSALON) 100 MG capsule  Every 8 hours     05/05/17 1958    azithromycin (ZITHROMAX) 250 MG tablet  Daily     05/05/17 1958    amoxicillin (AMOXIL) 500 MG capsule  3 times daily     05/05/17 1958       Lavera GuiseLiu, Brysyn Brandenberger Duo, MD 05/05/17 2000

## 2017-05-05 NOTE — Discharge Instructions (Signed)
You have signs of a left-sided pneumonia.  Please take antibiotics as prescribed.  Please return without fail for worsening symptoms, including fever, difficulty breathing, severe chest pains, confusion, or any other symptoms concerning to you.

## 2017-05-05 NOTE — ED Notes (Signed)
ED Provider at bedside. 

## 2017-05-05 NOTE — ED Triage Notes (Signed)
Cough x 2 weeks

## 2017-05-09 ENCOUNTER — Other Ambulatory Visit: Payer: Self-pay

## 2017-05-09 ENCOUNTER — Emergency Department (HOSPITAL_BASED_OUTPATIENT_CLINIC_OR_DEPARTMENT_OTHER)
Admission: EM | Admit: 2017-05-09 | Discharge: 2017-05-09 | Disposition: A | Discharge status: Home / Self Care | Payer: BLUE CROSS/BLUE SHIELD | Attending: Emergency Medicine | Admitting: Emergency Medicine

## 2017-05-09 ENCOUNTER — Encounter (HOSPITAL_BASED_OUTPATIENT_CLINIC_OR_DEPARTMENT_OTHER): Payer: Self-pay | Admitting: Emergency Medicine

## 2017-05-09 ENCOUNTER — Emergency Department (HOSPITAL_BASED_OUTPATIENT_CLINIC_OR_DEPARTMENT_OTHER): Payer: BLUE CROSS/BLUE SHIELD

## 2017-05-09 DIAGNOSIS — R05 Cough: Secondary | ICD-10-CM | POA: Diagnosis present

## 2017-05-09 DIAGNOSIS — R0789 Other chest pain: Secondary | ICD-10-CM | POA: Diagnosis not present

## 2017-05-09 DIAGNOSIS — I10 Essential (primary) hypertension: Secondary | ICD-10-CM | POA: Insufficient documentation

## 2017-05-09 DIAGNOSIS — J181 Lobar pneumonia, unspecified organism: Secondary | ICD-10-CM | POA: Diagnosis not present

## 2017-05-09 DIAGNOSIS — R0602 Shortness of breath: Secondary | ICD-10-CM | POA: Diagnosis not present

## 2017-05-09 DIAGNOSIS — Z87891 Personal history of nicotine dependence: Secondary | ICD-10-CM | POA: Diagnosis not present

## 2017-05-09 DIAGNOSIS — Z79899 Other long term (current) drug therapy: Secondary | ICD-10-CM | POA: Diagnosis not present

## 2017-05-09 DIAGNOSIS — J189 Pneumonia, unspecified organism: Secondary | ICD-10-CM

## 2017-05-09 LAB — CBC WITH DIFFERENTIAL/PLATELET
BASOS ABS: 0 10*3/uL (ref 0.0–0.1)
BASOS PCT: 0 %
EOS ABS: 0.3 10*3/uL (ref 0.0–0.7)
Eosinophils Relative: 3 %
HCT: 38 % — ABNORMAL LOW (ref 39.0–52.0)
Hemoglobin: 12.4 g/dL — ABNORMAL LOW (ref 13.0–17.0)
Lymphocytes Relative: 16 %
Lymphs Abs: 1.4 10*3/uL (ref 0.7–4.0)
MCH: 28.2 pg (ref 26.0–34.0)
MCHC: 32.6 g/dL (ref 30.0–36.0)
MCV: 86.4 fL (ref 78.0–100.0)
MONO ABS: 0.7 10*3/uL (ref 0.1–1.0)
MONOS PCT: 8 %
Neutro Abs: 6.5 10*3/uL (ref 1.7–7.7)
Neutrophils Relative %: 73 %
PLATELETS: 342 10*3/uL (ref 150–400)
RBC: 4.4 MIL/uL (ref 4.22–5.81)
RDW: 13.8 % (ref 11.5–15.5)
WBC: 8.9 10*3/uL (ref 4.0–10.5)

## 2017-05-09 LAB — BASIC METABOLIC PANEL
ANION GAP: 6 (ref 5–15)
BUN: 11 mg/dL (ref 6–20)
CALCIUM: 8.9 mg/dL (ref 8.9–10.3)
CO2: 26 mmol/L (ref 22–32)
CREATININE: 1.21 mg/dL (ref 0.61–1.24)
Chloride: 105 mmol/L (ref 101–111)
GLUCOSE: 92 mg/dL (ref 65–99)
Potassium: 3.3 mmol/L — ABNORMAL LOW (ref 3.5–5.1)
Sodium: 137 mmol/L (ref 135–145)

## 2017-05-09 LAB — I-STAT CG4 LACTIC ACID, ED: LACTIC ACID, VENOUS: 1.25 mmol/L (ref 0.5–1.9)

## 2017-05-09 MED ORDER — IPRATROPIUM-ALBUTEROL 0.5-2.5 (3) MG/3ML IN SOLN
3.0000 mL | Freq: Once | RESPIRATORY_TRACT | Status: AC
  Administered 2017-05-09: 3 mL via RESPIRATORY_TRACT
  Filled 2017-05-09: qty 3

## 2017-05-09 MED ORDER — ALBUTEROL SULFATE HFA 108 (90 BASE) MCG/ACT IN AERS
1.0000 | INHALATION_SPRAY | Freq: Once | RESPIRATORY_TRACT | Status: AC
  Administered 2017-05-09: 2 via RESPIRATORY_TRACT
  Filled 2017-05-09: qty 6.7

## 2017-05-09 NOTE — ED Notes (Signed)
Pt verbalizes understanding of d/c instructions and denies any further needs at this time. 

## 2017-05-09 NOTE — ED Provider Notes (Signed)
[facility] EMERGENCY DEPARTMENT Provider Note   CSN: [id number] Arrival date & time: 05/09/17  1809   History   Chief Complaint Chief Complaint  Patient presents with  . Cough    HPI Derrick Holden is a 36 y.o. male who presents with a cough. PMH significant for HTN. The patient states he was diagnosed with a URI about a month ago. Most of his symptoms improved and then he came back on 11/30 due to an ongoing cough. He was diagnosed with a left mid and lower lung pneumonia. He was given prescriptions for a Z-pack, Amoxicillin, and Tessalon. He has been taking this medicine however he feels like he is not able to cough anything up. He reports SOB at rest and with exertion as well as occasional wheezing. He also has chest pain with coughing but he does not have constant chest pain.   HPI  Past Medical History:  Diagnosis Date  . Chronic back pain   . Hypertension     Patient Active Problem List   Diagnosis Date Noted  . HEMORRHOIDS, INTERNAL, WITH BLEEDING 11/17/2009  . HYPERTENSION 08/17/2009  . BACK PAIN, LUMBAR, CHRONIC 08/17/2009    Past Surgical History:  Procedure Laterality Date  . CHOLECYSTECTOMY         Home Medications    Prior to Admission medications   Medication Sig Start Date End Date Taking? Authorizing Provider  amoxicillin (AMOXIL) 500 MG capsule Take 1 capsule (500 mg total) by mouth 3 (three) times daily. 05/05/17   Lavera GuiseLiu, Dana Duo, MD  azithromycin (ZITHROMAX) 250 MG tablet Take 1 tablet (250 mg total) by mouth daily. Take first 2 tablets together, then 1 every day until finished. 05/05/17   Lavera GuiseLiu, Dana Duo, MD  benzonatate (TESSALON) 100 MG capsule Take 1 capsule (100 mg total) by mouth every 8 (eight) hours. 05/05/17   Lavera GuiseLiu, Dana Duo, MD  dextromethorphan-guaiFENesin [facility](MUCINEX DM) 30-600 MG 12hr tablet Take 1 tablet 2 (two) times daily by mouth. 04/12/17   Derwood KaplanNanavati, Ankit, MD  losartan-hydrochlorothiazide (HYZAAR) 100-25 MG tablet Take 1  tablet daily by mouth.    [provider]    Family History History reviewed. No pertinent family history.  Social History Social History   Tobacco Use  . Smoking status: Former Games developermoker  . Smokeless tobacco: Never Used  Substance Use Topics  . Alcohol use: Yes    Comment: occ  . Drug use: No     Allergies   Patient has no known allergies.   Review of Systems Review of Systems  Constitutional: Negative for chills and fever.  HENT: Negative for congestion, ear pain, rhinorrhea and sore throat.   Respiratory: Positive for cough, shortness of breath and wheezing.   Cardiovascular: Positive for chest pain.  Gastrointestinal: Negative for abdominal pain, nausea and vomiting.  All other systems reviewed and are negative.    Physical Exam Updated Vital Signs BP 135/75 (BP Location: Left Arm)   Pulse (!) 105   Temp 98.5 F (36.9 C) (Oral)   Resp 18   Ht 6\' 4"  (1.93 m)   Wt (!) 145.2 kg (320 lb)   SpO2 99%   BMI 38.95 kg/m   Physical Exam  Constitutional: He is oriented to person, place, and time. He appears well-developed and well-nourished. No distress.  Calm, comfortable appearing  HENT:  Head: Normocephalic and atraumatic.  Eyes: Conjunctivae are normal. Pupils are equal, round, and reactive to light. Right eye exhibits no discharge. Left eye exhibits no  discharge. No scleral icterus.  Neck: Normal range of motion.  Cardiovascular: Regular rhythm. Tachycardia present. Exam reveals no gallop and no friction rub.  No murmur heard. Pulmonary/Chest: Effort normal and breath sounds normal. No stridor. No respiratory distress. He has no wheezes. He has no rales. He exhibits no tenderness.  Abdominal: Soft. Bowel sounds are normal. He exhibits no distension. There is no tenderness.  Neurological: He is alert and oriented to person, place, and time.  Skin: Skin is warm and dry.  Psychiatric: He has a normal mood and affect. His behavior is normal.  Nursing note  and vitals reviewed.    ED Treatments / Results  Labs (all labs ordered are listed, but only abnormal results are displayed) Labs Reviewed  BASIC METABOLIC PANEL - Abnormal; Notable for the following components:      Result Value   Potassium 3.3 (*)    All other components within normal limits  CBC WITH DIFFERENTIAL/PLATELET - Abnormal; Notable for the following components:   Hemoglobin 12.4 (*)    HCT 38.0 (*)    All other components within normal limits  CULTURE, BLOOD (ROUTINE X 2)  CULTURE, BLOOD (ROUTINE X 2)  I-STAT CG4 LACTIC ACID, ED  I-STAT CG4 LACTIC ACID, ED    EKG  EKG Interpretation None       Radiology Dg Chest 2 View  Result Date: 05/09/2017 CLINICAL DATA:  Chest pain.  Cough. EXAM: CHEST  2 VIEW COMPARISON:  Radiographs of May 05, 2017. FINDINGS: The heart size and mediastinal contours are within normal limits. No pneumothorax or pleural effusion is noted. Right lung is clear. Opacities seen in lingular segment of left upper lobe which is slightly improved compared to prior exam, most consistent with pneumonia. Left upper lobe opacity noted on prior exam has improved. The visualized skeletal structures are unremarkable. IMPRESSION: Left lingular opacity is noted most consistent with pneumonia. Followup PA and lateral chest X-ray is recommended in 3-4 weeks following trial of antibiotic therapy to ensure resolution and exclude underlying malignancy. Electronically Signed   By: Lupita RaiderJames  Green Jr, M.D.   On: 05/09/2017 19:33    Procedures Procedures (including critical care time)  Medications Ordered in ED Medications  ipratropium-albuterol (DUONEB) 0.5-2.5 (3) MG/3ML nebulizer solution 3 mL (3 mLs Nebulization Given 05/09/17 2031)  albuterol (PROVENTIL HFA;VENTOLIN HFA) 108 (90 Base) MCG/ACT inhaler 1-2 puff (2 puffs Inhalation Given 05/09/17 2238)     Initial Impression / Assessment and Plan / ED Course  I have reviewed the triage vital signs and the  nursing notes.  Pertinent labs & imaging results that were available during my care of the patient were reviewed by me and considered in my medical decision making (see chart for details).  36 year old male presents with ongoing cough and now CP/SOB. He is initially tachycardic. This has improved throughout his ED stay. Otherwise vitals are normal. On exam he is comfortable appearing. Labs obtained are overall normal. CXR shows improving infiltrate. He was given a duoneb and was ambulated in the ED and did not develop hypoxia. Will give albuterol inhaler and advised to continue meds prescribed. Return precautions given.  Final Clinical Impressions(s) / ED Diagnoses   Final diagnoses:  Community acquired pneumonia of left upper lobe of lung [facility](HCC)    ED Discharge Orders    None       Bethel BornGekas, Shandrea Lusk Marie, PA-C 05/10/17 [id number]    Arby BarrettePfeiffer, Marcy, MD 05/11/17 [phone]

## 2017-05-09 NOTE — ED Triage Notes (Signed)
Patient states that he was here last week for SOb and Cough - the patient was dx he had Pneumonia. The patient states that he now has pain when he takes a deep breath and he is having increased SOB

## 2017-05-09 NOTE — ED Notes (Signed)
O 2 sat while walking 95-97%  With HR 95 to 101

## 2017-05-09 NOTE — Discharge Instructions (Signed)
Use inhaler as needed for shortness of breath or wheezing Return if worsening

## 2017-05-14 LAB — CULTURE, BLOOD (ROUTINE X 2)
CULTURE: NO GROWTH
Culture: NO GROWTH
SPECIAL REQUESTS: ADEQUATE
Special Requests: ADEQUATE

## 2017-08-07 ENCOUNTER — Encounter (HOSPITAL_BASED_OUTPATIENT_CLINIC_OR_DEPARTMENT_OTHER): Payer: Self-pay | Admitting: Emergency Medicine

## 2017-08-07 ENCOUNTER — Other Ambulatory Visit: Payer: Self-pay

## 2017-08-07 ENCOUNTER — Emergency Department (HOSPITAL_BASED_OUTPATIENT_CLINIC_OR_DEPARTMENT_OTHER)
Admission: EM | Admit: 2017-08-07 | Discharge: 2017-08-07 | Disposition: A | Discharge status: Home / Self Care | Payer: BLUE CROSS/BLUE SHIELD | Attending: Emergency Medicine | Admitting: Emergency Medicine

## 2017-08-07 DIAGNOSIS — I10 Essential (primary) hypertension: Secondary | ICD-10-CM | POA: Insufficient documentation

## 2017-08-07 DIAGNOSIS — M5441 Lumbago with sciatica, right side: Secondary | ICD-10-CM | POA: Diagnosis not present

## 2017-08-07 DIAGNOSIS — Z79899 Other long term (current) drug therapy: Secondary | ICD-10-CM | POA: Insufficient documentation

## 2017-08-07 DIAGNOSIS — Z87891 Personal history of nicotine dependence: Secondary | ICD-10-CM | POA: Diagnosis not present

## 2017-08-07 DIAGNOSIS — M545 Low back pain: Secondary | ICD-10-CM | POA: Diagnosis present

## 2017-08-07 MED ORDER — OXYCODONE-ACETAMINOPHEN 5-325 MG PO TABS
1.0000 | ORAL_TABLET | Freq: Once | ORAL | Status: AC
  Administered 2017-08-07: 1 via ORAL
  Filled 2017-08-07: qty 1

## 2017-08-07 MED ORDER — CYCLOBENZAPRINE HCL 10 MG PO TABS: 10.0000 mg | ORAL_TABLET | Freq: Two times a day (BID) | ORAL | 0 refills | Status: DC | PRN

## 2017-08-07 MED ORDER — NAPROXEN 375 MG PO TABS: 375.0000 mg | ORAL_TABLET | Freq: Every day | ORAL | 0 refills | Status: DC

## 2017-08-07 MED ORDER — PREDNISONE 20 MG PO TABS: ORAL_TABLET | ORAL | 0 refills | Status: DC

## 2017-08-07 NOTE — ED Triage Notes (Signed)
Patient reports right lower back pain with sudden onset.  Denies injury.  Reports the pain radiates down his right leg.  Reports the pain began last week.  Denies any loss of bowel or bladder control.

## 2017-08-07 NOTE — Discharge Instructions (Signed)
Limit use of naproxen to once a day if tylenol is not effective in relieving pain. You have been given prescriptions for a muscle relaxant and a short course of steroids. Follow-up with your primary care provider as discussed.

## 2017-08-07 NOTE — ED Provider Notes (Signed)
[facility] EMERGENCY DEPARTMENT Provider Note   CSN: [id number] Arrival date & time: 08/07/17  1819     History   Chief Complaint Chief Complaint  Patient presents with  . Back Pain    HPI Derrick Holden is a 37 y.o. male.  Patient with right sided low back pain radiating to the right leg. No known injury, but does frequently lift heavy objects at work.    The history is provided by the patient. No language interpreter was used.  Back Pain   This is a new problem. The current episode started more than 2 days ago. The problem occurs constantly. The problem has not changed since onset.The pain is associated with no known injury. The pain is present in the lumbar spine. The quality of the pain is described as shooting. The pain radiates to the right thigh. The pain is moderate. The symptoms are aggravated by certain positions. The pain is the same all the time. Pertinent negatives include no abdominal pain, no bowel incontinence, no perianal numbness, no bladder incontinence and no dysuria. He has tried analgesics for the symptoms. The treatment provided no relief.    Past Medical History:  Diagnosis Date  . Chronic back pain   . Hypertension     Patient Active Problem List   Diagnosis Date Noted  . HEMORRHOIDS, INTERNAL, WITH BLEEDING 11/17/2009  . HYPERTENSION 08/17/2009  . BACK PAIN, LUMBAR, CHRONIC 08/17/2009    Past Surgical History:  Procedure Laterality Date  . CHOLECYSTECTOMY         Home Medications    Prior to Admission medications   Medication Sig Start Date End Date Taking? Authorizing Provider  amoxicillin (AMOXIL) 500 MG capsule Take 1 capsule (500 mg total) by mouth 3 (three) times daily. 05/05/17   Lavera Guise, MD  azithromycin (ZITHROMAX) 250 MG tablet Take 1 tablet (250 mg total) by mouth daily. Take first 2 tablets together, then 1 every day until finished. 05/05/17   Lavera Guise, MD  benzonatate (TESSALON) 100 MG capsule Take 1  capsule (100 mg total) by mouth every 8 (eight) hours. 05/05/17   Lavera Guise, MD  dextromethorphan-guaiFENesin [facility] DM) 30-600 MG 12hr tablet Take 1 tablet 2 (two) times daily by mouth. 04/12/17   Derwood Kaplan, MD  losartan-hydrochlorothiazide (HYZAAR) 100-25 MG tablet Take 1 tablet daily by mouth.    [provider]    Family History History reviewed. No pertinent family history.  Social History Social History   Tobacco Use  . Smoking status: Former Games developer  . Smokeless tobacco: Never Used  Substance Use Topics  . Alcohol use: Yes    Comment: occ  . Drug use: No     Allergies   Patient has no known allergies.   Review of Systems Review of Systems  Gastrointestinal: Negative for abdominal pain and bowel incontinence.  Genitourinary: Negative for bladder incontinence and dysuria.  Musculoskeletal: Positive for back pain.  All other systems reviewed and are negative.    Physical Exam Updated Vital Signs BP 134/90   Pulse 95   Temp 98.6 F (37 C) (Oral)   Resp 18   Ht 6\' 4"  (1.93 m)   Wt (!) 149.7 kg (330 lb)   SpO2 100%   BMI 40.17 kg/m   Physical Exam  Constitutional: He is oriented to person, place, and time. He appears well-developed and well-nourished.  HENT:  Head: Atraumatic.  Eyes: Conjunctivae are normal.  Neck: Normal range of motion. Neck  supple.  Cardiovascular: Normal rate and regular rhythm.  Pulmonary/Chest: Effort normal and breath sounds normal.  Abdominal: Soft. Bowel sounds are normal.  Musculoskeletal: He exhibits tenderness.       Lumbar back: He exhibits tenderness and spasm.       Back:  Neurological: He is alert and oriented to person, place, and time. No sensory deficit.  Skin: Skin is warm and dry.  Psychiatric: He has a normal mood and affect.  Nursing note and vitals reviewed.    ED Treatments / Results  Labs (all labs ordered are listed, but only abnormal results are displayed) Labs Reviewed - No data to  display  EKG  EKG Interpretation None       Radiology No results found.  Procedures Procedures (including critical care time)  Medications Ordered in ED Medications  oxyCODONE-acetaminophen (PERCOCET/ROXICET) 5-325 MG per tablet 1 tablet (1 tablet Oral Given 08/07/17 2133)     Initial Impression / Assessment and Plan / ED Course  I have reviewed the triage vital signs and the nursing notes.  Pertinent labs & imaging results that were available during my care of the patient were reviewed by me and considered in my medical decision making (see chart for details).     Patient with back pain.  No neurological deficits and normal neuro exam.  Patient is ambulatory.  No loss of bowel or bladder control.  No concern for cauda equina.  No fever, night sweats, weight loss, h/o cancer, IVDA, no recent procedure to back. No urinary symptoms suggestive of UTI.  Supportive care and return precaution discussed. Appears safe for discharge at this time. Follow up as indicated in discharge paperwork.   Final Clinical Impressions(s) / ED Diagnoses   Final diagnoses:  Acute right-sided low back pain with right-sided sciatica    ED Discharge Orders        Ordered    predniSONE (DELTASONE) 20 MG tablet     08/07/17 2109    cyclobenzaprine (FLEXERIL) 10 MG tablet  2 times daily PRN     08/07/17 2109    naproxen (NAPROSYN) 375 MG tablet  Daily     08/07/17 2109       Felicie MornSmith, Krissa Utke, NP 08/07/17 2137    Arby BarrettePfeiffer, Marcy, MD 08/12/17 [phone]

## 2017-10-11 ENCOUNTER — Emergency Department (HOSPITAL_BASED_OUTPATIENT_CLINIC_OR_DEPARTMENT_OTHER): Payer: BLUE CROSS/BLUE SHIELD

## 2017-10-11 ENCOUNTER — Encounter (HOSPITAL_BASED_OUTPATIENT_CLINIC_OR_DEPARTMENT_OTHER): Payer: Self-pay

## 2017-10-11 ENCOUNTER — Emergency Department (HOSPITAL_BASED_OUTPATIENT_CLINIC_OR_DEPARTMENT_OTHER)
Admission: EM | Admit: 2017-10-11 | Discharge: 2017-10-11 | Disposition: A | Discharge status: Home / Self Care | Payer: BLUE CROSS/BLUE SHIELD | Attending: Emergency Medicine | Admitting: Emergency Medicine

## 2017-10-11 ENCOUNTER — Other Ambulatory Visit: Payer: Self-pay

## 2017-10-11 DIAGNOSIS — R0602 Shortness of breath: Secondary | ICD-10-CM | POA: Diagnosis present

## 2017-10-11 DIAGNOSIS — Z79899 Other long term (current) drug therapy: Secondary | ICD-10-CM | POA: Diagnosis not present

## 2017-10-11 DIAGNOSIS — E876 Hypokalemia: Secondary | ICD-10-CM | POA: Insufficient documentation

## 2017-10-11 DIAGNOSIS — I10 Essential (primary) hypertension: Secondary | ICD-10-CM | POA: Diagnosis not present

## 2017-10-11 DIAGNOSIS — Z87891 Personal history of nicotine dependence: Secondary | ICD-10-CM | POA: Insufficient documentation

## 2017-10-11 DIAGNOSIS — Z9049 Acquired absence of other specified parts of digestive tract: Secondary | ICD-10-CM | POA: Insufficient documentation

## 2017-10-11 LAB — BASIC METABOLIC PANEL
Anion gap: 13 (ref 5–15)
BUN: 12 mg/dL (ref 6–20)
CALCIUM: 9.2 mg/dL (ref 8.9–10.3)
CO2: 26 mmol/L (ref 22–32)
CREATININE: 1.05 mg/dL (ref 0.61–1.24)
Chloride: 102 mmol/L (ref 101–111)
GFR calc Af Amer: >60 mL/min (ref >60)
GFR calc non Af Amer: >60 mL/min (ref >60)
GLUCOSE: 89 mg/dL (ref 65–99)
POTASSIUM: 2.8 mmol/L — AB (ref 3.5–5.1)
SODIUM: 141 mmol/L (ref 135–145)

## 2017-10-11 LAB — CBC
HCT: 39.4 % (ref 39.0–52.0)
Hemoglobin: 13.3 g/dL (ref 13.0–17.0)
MCH: 28.7 pg (ref 26.0–34.0)
MCHC: 33.8 g/dL (ref 30.0–36.0)
MCV: 84.9 fL (ref 78.0–100.0)
PLATELETS: 314 10*3/uL (ref 150–400)
RBC: 4.64 MIL/uL (ref 4.22–5.81)
RDW: 14.1 % (ref 11.5–15.5)
WBC: 9.1 10*3/uL (ref 4.0–10.5)

## 2017-10-11 LAB — TROPONIN I

## 2017-10-11 MED ORDER — POTASSIUM CHLORIDE CRYS ER 20 MEQ PO TBCR
40.0000 meq | EXTENDED_RELEASE_TABLET | Freq: Once | ORAL | Status: AC
  Administered 2017-10-11: 40 meq via ORAL
  Filled 2017-10-11: qty 2

## 2017-10-11 MED ORDER — POTASSIUM CHLORIDE CRYS ER 20 MEQ PO TBCR: 20.0000 meq | EXTENDED_RELEASE_TABLET | Freq: Two times a day (BID) | ORAL | 0 refills | Status: DC

## 2017-10-11 NOTE — ED Notes (Signed)
Patient states he has been SOB for the past "several weeks".  States when he lays down he starts coughing.

## 2017-10-11 NOTE — ED Triage Notes (Signed)
C/o SOB, CP, reflux, congestion x1 week-NAD-steady gait

## 2017-10-11 NOTE — ED Provider Notes (Signed)
[facility] EMERGENCY DEPARTMENT Provider Note   CSN: [id number] Arrival date & time: 10/11/17  1833     History   Chief Complaint Chief Complaint  Patient presents with  . Shortness of Breath    HPI Derrick Holden is a 37 y.o. male.  HPI  37 year old male presents with shortness of breath and cough.  Has been ongoing for about a week.  Cough is mostly at night and nonproductive.  Shortness of breath is random throughout the day and last about 30 minutes a time.  Nothing sets it off.  Exertion does not cause the shortness of breath.  He has chronic chest tightness for multiple years that is unchanged and not correlating with the shortness of breath.  He has not had fever or any other symptoms.  He smokes intermittently but denies any lung problems.  He had an albuterol inhaler with spacer left over from when he had pneumonia and he tried that earlier today and it seemed to help.  No leg swelling.  Past Medical History:  Diagnosis Date  . Chronic back pain   . Hypertension     Patient Active Problem List   Diagnosis Date Noted  . HEMORRHOIDS, INTERNAL, WITH BLEEDING 11/17/2009  . HYPERTENSION 08/17/2009  . BACK PAIN, LUMBAR, CHRONIC 08/17/2009    Past Surgical History:  Procedure Laterality Date  . CHOLECYSTECTOMY          Home Medications    Prior to Admission medications   Medication Sig Start Date End Date Taking? Authorizing Provider  amoxicillin (AMOXIL) 500 MG capsule Take 1 capsule (500 mg total) by mouth 3 (three) times daily. 05/05/17   Lavera Guise, MD  azithromycin (ZITHROMAX) 250 MG tablet Take 1 tablet (250 mg total) by mouth daily. Take first 2 tablets together, then 1 every day until finished. 05/05/17   Lavera Guise, MD  benzonatate (TESSALON) 100 MG capsule Take 1 capsule (100 mg total) by mouth every 8 (eight) hours. 05/05/17   Lavera Guise, MD  cyclobenzaprine (FLEXERIL) 10 MG tablet Take 1 tablet (10 mg total) by mouth 2 (two) times  daily as needed for muscle spasms. 08/07/17   Felicie Morn, NP  dextromethorphan-guaiFENesin [facility] DM) 30-600 MG 12hr tablet Take 1 tablet 2 (two) times daily by mouth. 04/12/17   Derwood Kaplan, MD  losartan-hydrochlorothiazide (HYZAAR) 100-25 MG tablet Take 1 tablet daily by mouth.    [provider]  naproxen (NAPROSYN) 375 MG tablet Take 1 tablet (375 mg total) by mouth daily. 08/07/17   Felicie Morn, NP  potassium chloride SA (K-DUR,KLOR-CON) 20 MEQ tablet Take 1 tablet (20 mEq total) by mouth 2 (two) times daily for 3 days. 10/11/17 10/14/17  Pricilla Loveless, MD  predniSONE (DELTASONE) 20 MG tablet 3 tabs po day one, then 2 tabs daily x 4 days 08/07/17   Felicie Morn, NP    Family History No family history on file.  Social History Social History   Tobacco Use  . Smoking status: Former Games developer  . Smokeless tobacco: Never Used  Substance Use Topics  . Alcohol use: Yes    Comment: occ  . Drug use: No     Allergies   Patient has no known allergies.   Review of Systems Review of Systems  Constitutional: Negative for fever.  HENT: Negative for sore throat.   Respiratory: Positive for cough, chest tightness and shortness of breath.   Cardiovascular: Negative for leg swelling.  Gastrointestinal: Negative for vomiting.  All  other systems reviewed and are negative.    Physical Exam Updated Vital Signs BP 123/85 (BP Location: Right Arm)   Pulse 79   Temp 98.3 F (36.8 C) (Oral)   Resp 19   Ht  (1.93 m)   Wt (!) 148.3 kg (327 lb)   SpO2 100%   BMI 39.80 kg/m   Physical Exam  Constitutional: He is oriented to person, place, and time. He appears well-developed and well-nourished.  Non-toxic appearance. He does not appear ill. No distress.  HENT:  Head: Normocephalic and atraumatic.  Right Ear: External ear normal.  Left Ear: External ear normal.  Nose: Nose normal.  Eyes: Right eye exhibits no discharge. Left eye exhibits no discharge.  Neck: Neck supple.    Cardiovascular: Normal rate, regular rhythm and normal heart sounds.  Pulmonary/Chest: Effort normal. He has decreased breath sounds (slightly, at bases). He has no wheezes. He exhibits no tenderness.  Abdominal: Soft. There is no tenderness.  Musculoskeletal: He exhibits no edema.  Neurological: He is alert and oriented to person, place, and time.  Skin: Skin is warm and dry.  Nursing note and vitals reviewed.    ED Treatments / Results  Labs (all labs ordered are listed, but only abnormal results are displayed) Labs Reviewed  BASIC METABOLIC PANEL - Abnormal; Notable for the following components:      Result Value   Potassium 2.8 (*)    All other components within normal limits  CBC  TROPONIN I    EKG EKG Interpretation  Date/Time:  Wednesday Oct 11 2017 18:46:22 EDT Ventricular Rate:  95 PR Interval:  166 QRS Duration: 106 QT Interval:  376 QTC Calculation: 472 R Axis:   15 Text Interpretation:  Normal sinus rhythm no acute ST/T changes no significant change since Dec 2018 Confirmed by Pricilla Loveless [id number]) on 10/11/2017 9:36:31 PM   Radiology Dg Chest 2 View  Result Date: 10/11/2017 CLINICAL DATA:  Chest pain and cough. EXAM: CHEST - 2 VIEW COMPARISON:  05/09/2017 FINDINGS: The heart size and mediastinal contours are within normal limits. Mild bibasilar atelectasis present. There is no evidence of pulmonary edema, consolidation, pneumothorax, nodule or pleural fluid. The visualized skeletal structures are unremarkable. IMPRESSION: No active cardiopulmonary disease. Electronically Signed   By: Irish Lack M.D.   On: 10/11/2017 19:25    Procedures Procedures (including critical care time)  Medications Ordered in ED Medications  potassium chloride SA (K-DUR,KLOR-CON) CR tablet 40 mEq (40 mEq Oral Given 10/11/17 2208)     Initial Impression / Assessment and Plan / ED Course  I have reviewed the triage vital signs and the nursing notes.  Pertinent labs & imaging  results that were available during my care of the patient were reviewed by me and considered in my medical decision making (see chart for details).     Patient has atypical shortness of breath that comes and goes without obvious cause.  He does have a mild nonproductive cough.  I do not hear specific wheezing but he does have mildly decreased sounds in the bases.  Given albuterol has helped at home I will encourage him to continue using this as needed.  He follows up with his PCP is reasonable to try the albuterol and see if this is helping.  However my suspicion for ACS, PE, or acute bacterial infection is quite low.  He does have some moderate hypokalemia and will be given potassium replacement.  We discussed return precautions.  Final Clinical Impressions(s) /  ED Diagnoses   Final diagnoses:  Shortness of breath  Hypokalemia    ED Discharge Orders        Ordered    potassium chloride SA (K-DUR,KLOR-CON) 20 MEQ tablet  2 times daily     10/11/17 2204       Pricilla Loveless, MD 10/12/17 [phone]

## 2017-10-11 NOTE — ED Notes (Signed)
Pt verbalizes understanding of d/c instructions and denies any further needs at this time. 

## 2017-10-11 NOTE — Discharge Instructions (Addendum)
Use your albuterol inhaler 1-2 puffs every 4 hours as needed for shortness of breath.  If you are developing worsening or continued shortness of breath or new or concerning chest pain or any other new or concerning symptoms then return to the ER or see her doctor.

## 2017-10-11 NOTE — ED Notes (Signed)
Lungs clear bilaterally.

## 2017-12-03 ENCOUNTER — Emergency Department (HOSPITAL_BASED_OUTPATIENT_CLINIC_OR_DEPARTMENT_OTHER)
Admission: EM | Admit: 2017-12-03 | Discharge: 2017-12-03 | Disposition: A | Discharge status: Home / Self Care | Payer: BLUE CROSS/BLUE SHIELD | Attending: Emergency Medicine | Admitting: Emergency Medicine

## 2017-12-03 ENCOUNTER — Emergency Department (HOSPITAL_BASED_OUTPATIENT_CLINIC_OR_DEPARTMENT_OTHER): Payer: BLUE CROSS/BLUE SHIELD

## 2017-12-03 ENCOUNTER — Encounter (HOSPITAL_BASED_OUTPATIENT_CLINIC_OR_DEPARTMENT_OTHER): Payer: Self-pay | Admitting: Emergency Medicine

## 2017-12-03 ENCOUNTER — Other Ambulatory Visit: Payer: Self-pay

## 2017-12-03 DIAGNOSIS — Y9389 Activity, other specified: Secondary | ICD-10-CM | POA: Insufficient documentation

## 2017-12-03 DIAGNOSIS — X500XXA Overexertion from strenuous movement or load, initial encounter: Secondary | ICD-10-CM | POA: Diagnosis not present

## 2017-12-03 DIAGNOSIS — S3992XA Unspecified injury of lower back, initial encounter: Secondary | ICD-10-CM | POA: Diagnosis present

## 2017-12-03 DIAGNOSIS — Z79899 Other long term (current) drug therapy: Secondary | ICD-10-CM | POA: Diagnosis not present

## 2017-12-03 DIAGNOSIS — S39012A Strain of muscle, fascia and tendon of lower back, initial encounter: Secondary | ICD-10-CM | POA: Insufficient documentation

## 2017-12-03 DIAGNOSIS — I10 Essential (primary) hypertension: Secondary | ICD-10-CM | POA: Insufficient documentation

## 2017-12-03 DIAGNOSIS — Z87891 Personal history of nicotine dependence: Secondary | ICD-10-CM | POA: Insufficient documentation

## 2017-12-03 DIAGNOSIS — Y999 Unspecified external cause status: Secondary | ICD-10-CM | POA: Insufficient documentation

## 2017-12-03 DIAGNOSIS — Y929 Unspecified place or not applicable: Secondary | ICD-10-CM | POA: Diagnosis not present

## 2017-12-03 MED ORDER — TRAMADOL HCL 50 MG PO TABS: 50.0000 mg | ORAL_TABLET | Freq: Four times a day (QID) | ORAL | 0 refills | Status: DC | PRN

## 2017-12-03 MED ORDER — MELOXICAM 15 MG PO TABS: 15.0000 mg | ORAL_TABLET | Freq: Every day | ORAL | 0 refills | Status: DC

## 2017-12-03 MED ORDER — CYCLOBENZAPRINE HCL 10 MG PO TABS: 5.0000 mg | ORAL_TABLET | Freq: Two times a day (BID) | ORAL | 0 refills | Status: DC | PRN

## 2017-12-03 NOTE — Discharge Instructions (Addendum)
[facility] [place] [id number] [place], [place] [address]  [phone]     Get help right away if: Your back pain is severe. You cannot stand or walk. You have difficulty controlling when you urinate or when you have a bowel movement. You feel nauseous or you vomit. Your feet get very cold. You have numbness, tingling, weakness, or problems using your arms or legs. You develop any of the following: Shortness of breath. Dizziness. Pain in your legs. Weakness in your buttocks or legs. Discoloration of the skin on your toes or legs.

## 2017-12-03 NOTE — ED Provider Notes (Signed)
[facility] EMERGENCY DEPARTMENT Provider Note   CSN: [id number] Arrival date & time: 12/03/17  1625     History   Chief Complaint Chief Complaint  Patient presents with  . Back Pain    HPI Derrick Holden is a 37 y.o. male Since the emergency department with chief complaint of right low back pain.  Patient states that he reached down to pick issues up yesterday and as he was standing up he had sudden onset of gripping right lower back pain.  He states he was unable to fully stand up.  He states that his uncle came by who he just had knee surgery and gave him a single Percocet which put him to sleep however this morning when he woke up he was still having pretty severe pain.  He is unable to stand upright without pain.  He has pain with twisting flexion or extension of his lower back.  He denies radiating pain, numbness, tingling, weakness, saddle anesthesia, loss of bowel or bladder control.  He states he has a history of sciatica but this is different.  The patient works as the Mudlogger of a warehouse and states that he does do a lot of heavy lifting.       Past Medical History:  Diagnosis Date  . Chronic back pain   . Hypertension     Patient Active Problem List   Diagnosis Date Noted  . HEMORRHOIDS, INTERNAL, WITH BLEEDING 11/17/2009  . HYPERTENSION 08/17/2009  . BACK PAIN, LUMBAR, CHRONIC 08/17/2009    Past Surgical History:  Procedure Laterality Date  . CHOLECYSTECTOMY          Home Medications    Prior to Admission medications   Medication Sig Start Date End Date Taking? Authorizing Provider  amoxicillin (AMOXIL) 500 MG capsule Take 1 capsule (500 mg total) by mouth 3 (three) times daily. 05/05/17   Lavera Guise, MD  azithromycin (ZITHROMAX) 250 MG tablet Take 1 tablet (250 mg total) by mouth daily. Take first 2 tablets together, then 1 every day until finished. 05/05/17   Lavera Guise, MD  benzonatate (TESSALON) 100 MG capsule Take 1  capsule (100 mg total) by mouth every 8 (eight) hours. 05/05/17   Lavera Guise, MD  cyclobenzaprine (FLEXERIL) 10 MG tablet Take 1 tablet (10 mg total) by mouth 2 (two) times daily as needed for muscle spasms. 08/07/17   Felicie Morn, NP  dextromethorphan-guaiFENesin [facility]) 30-600 MG 12hr tablet Take 1 tablet 2 (two) times daily by mouth. 04/12/17   Derwood Kaplan, MD  losartan-hydrochlorothiazide (HYZAAR) 100-25 MG tablet Take 1 tablet daily by mouth.    [provider]  naproxen (NAPROSYN) 375 MG tablet Take 1 tablet (375 mg total) by mouth daily. 08/07/17   Felicie Morn, NP  potassium chloride SA (K-DUR,KLOR-CON) 20 MEQ tablet Take 1 tablet (20 mEq total) by mouth 2 (two) times daily for 3 days. 10/11/17 10/14/17  Pricilla Loveless, MD  predniSONE (DELTASONE) 20 MG tablet 3 tabs po day one, then 2 tabs daily x 4 days 08/07/17   Felicie Morn, NP    Family History History reviewed. No pertinent family history.  Social History Social History   Tobacco Use  . Smoking status: Former Games developer  . Smokeless tobacco: Never Used  Substance Use Topics  . Alcohol use: Yes    Comment: occ  . Drug use: No     Allergies   Patient has no known allergies.   Review of Systems  Review of Systems Ten systems reviewed and are negative for acute change, except as noted in the HPI.    Physical Exam Updated Vital Signs BP 124/69 (BP Location: Left Arm)   Pulse 93   Temp 98.5 F (36.9 C) (Oral)   Resp 18   Ht 6\' 3"  (1.905 m)   Wt (!) 148.3 kg (327 lb)   SpO2 100%   BMI 40.87 kg/m   Physical Exam  Constitutional: He is oriented to person, place, and time. He appears well-developed and well-nourished. No distress.  HENT:  Head: Normocephalic and atraumatic.  Eyes: Conjunctivae are normal. No scleral icterus.  Neck: Normal range of motion. Neck supple.  Cardiovascular: Normal rate, regular rhythm and normal heart sounds.  Pulmonary/Chest: Effort normal and breath sounds normal. No  respiratory distress.  Abdominal: Soft. There is no tenderness.  Musculoskeletal: He exhibits no edema.  Neurological: He is alert and oriented to person, place, and time.  Patient with tenderness over the midline lumbar spine, and in the left lumbar paraspinal tissue.  Patient has pain with left lateral flexion, leftward twisting, forward flexion extension of the spine on the right side of his lower back.  He is able to flex the left and hip without pain.  Skin: Skin is warm and dry. He is not diaphoretic.  Psychiatric: His behavior is normal.  Nursing note and vitals reviewed.    ED Treatments / Results  Labs (all labs ordered are listed, but only abnormal results are displayed) Labs Reviewed - No data to display  EKG None  Radiology No results found.  Procedures Procedures (including critical care time)  Medications Ordered in ED Medications - No data to display   Initial Impression / Assessment and Plan / ED Course  I have reviewed the triage vital signs and the nursing notes.  Pertinent labs & imaging results that were available during my care of the patient were reviewed by me and considered in my medical decision making (see chart for details).     Patient with back pain.  No neurological deficits and normal neuro exam.  Patient can walk but states is painful.  No loss of bowel or bladder control.  No concern for cauda equina.  No fever, night sweats, weight loss, h/o cancer, IVDU.  RICE protocol and pain medicine indicated and discussed with patient.   Final Clinical Impressions(s) / ED Diagnoses   Final diagnoses:  None    ED Discharge Orders    None       Arthor CaptainHarris, Steed Kanaan, PA-C 12/04/17 0020    Rolan BuccoBelfi, Melanie, MD 12/06/17 [phone]

## 2017-12-03 NOTE — ED Triage Notes (Signed)
Patient states that he bent down to change his shoe yesterday and he hurt his lower back

## 2018-02-13 ENCOUNTER — Encounter (HOSPITAL_BASED_OUTPATIENT_CLINIC_OR_DEPARTMENT_OTHER): Payer: Self-pay

## 2018-02-13 ENCOUNTER — Emergency Department (HOSPITAL_BASED_OUTPATIENT_CLINIC_OR_DEPARTMENT_OTHER): Payer: BLUE CROSS/BLUE SHIELD

## 2018-02-13 ENCOUNTER — Emergency Department (HOSPITAL_BASED_OUTPATIENT_CLINIC_OR_DEPARTMENT_OTHER)
Admission: EM | Admit: 2018-02-13 | Discharge: 2018-02-14 | Disposition: A | Discharge status: Home / Self Care | Payer: BLUE CROSS/BLUE SHIELD | Attending: Emergency Medicine | Admitting: Emergency Medicine

## 2018-02-13 ENCOUNTER — Other Ambulatory Visit: Payer: Self-pay

## 2018-02-13 DIAGNOSIS — Z79899 Other long term (current) drug therapy: Secondary | ICD-10-CM | POA: Insufficient documentation

## 2018-02-13 DIAGNOSIS — Z87891 Personal history of nicotine dependence: Secondary | ICD-10-CM | POA: Insufficient documentation

## 2018-02-13 DIAGNOSIS — R221 Localized swelling, mass and lump, neck: Secondary | ICD-10-CM | POA: Insufficient documentation

## 2018-02-13 DIAGNOSIS — Y9389 Activity, other specified: Secondary | ICD-10-CM | POA: Diagnosis not present

## 2018-02-13 DIAGNOSIS — Y92410 Unspecified street and highway as the place of occurrence of the external cause: Secondary | ICD-10-CM | POA: Diagnosis not present

## 2018-02-13 DIAGNOSIS — Y999 Unspecified external cause status: Secondary | ICD-10-CM | POA: Insufficient documentation

## 2018-02-13 DIAGNOSIS — I10 Essential (primary) hypertension: Secondary | ICD-10-CM | POA: Insufficient documentation
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
## 2018-02-13 NOTE — Discharge Instructions (Addendum)
Your x-ray was normal today. Your collarbone is not fractured or dislocated.  Do not be surprised if you are more sore tomorrow. You may use Tylenol and/or Ibuprofen for pain relief. You may also see relief with warm or cold compresses.  Thank you for your patience tonight.

## 2018-03-04 IMAGING — CT CT RENAL STONE PROTOCOL
2 of 4 series · 16 of 46 positions shown, 18 images · non-contrast
Comparison: 05/25/2008

CLINICAL DATA: Right flank pain for 2 weeks with hematuria

EXAM:
CT ABDOMEN AND PELVIS WITHOUT CONTRAST
TECHNIQUE: Multidetector CT imaging of the abdomen and pelvis was performed
following the standard protocol without IV contrast.

[Series 2: axial st · axial · 0.98mm/px · z∈[-486,-1]mm · 13 of 107 slices shown, 15 images]
[im 5/107  soft-tissue]
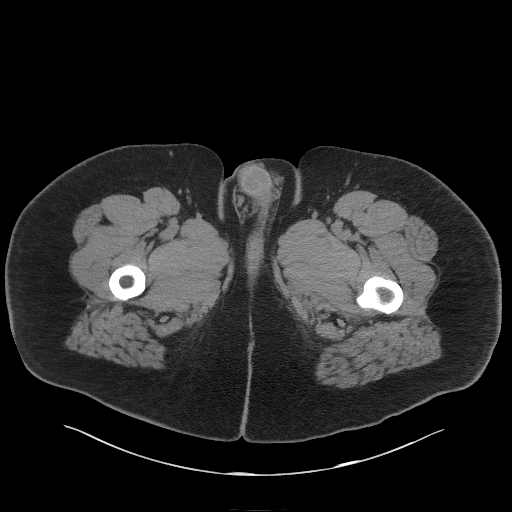
[im 5/107  bone]
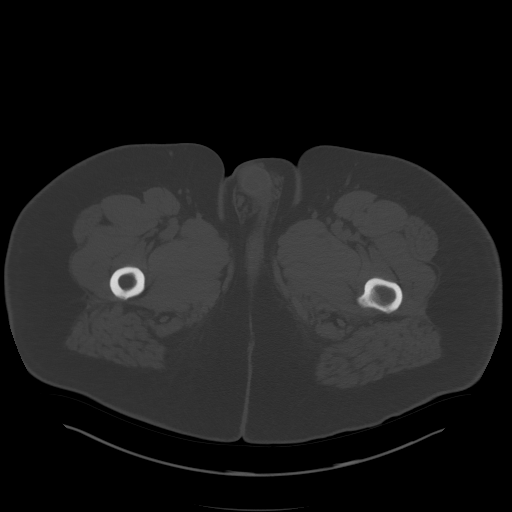
[im 13/107  soft-tissue]
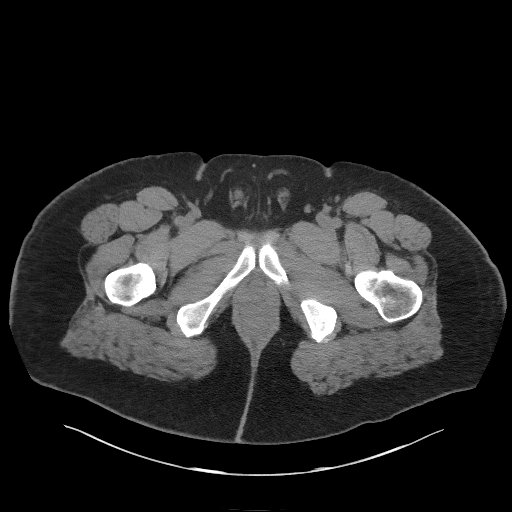
[im 22/107  soft-tissue]
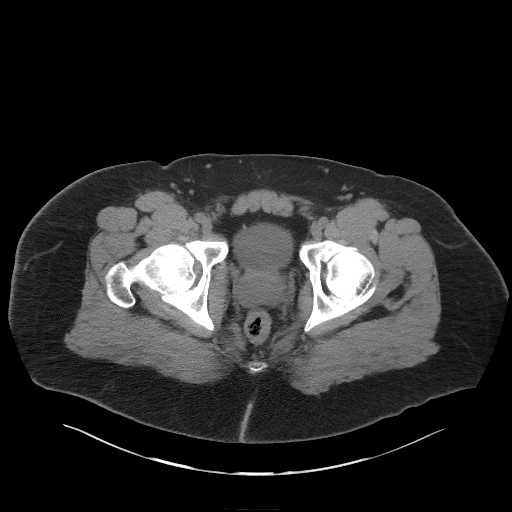
[im 30/107  soft-tissue]
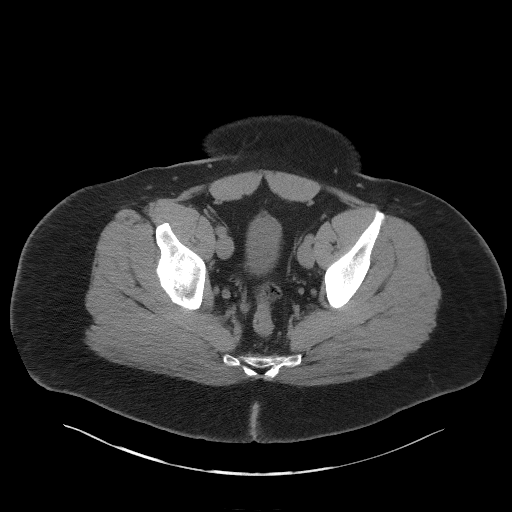
[im 39/107  soft-tissue]
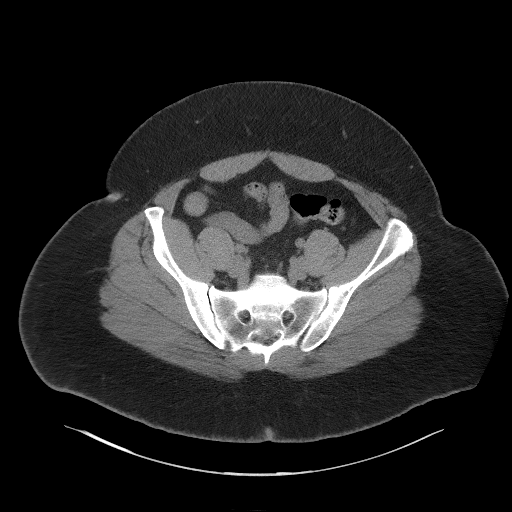
[im 47/107  soft-tissue]
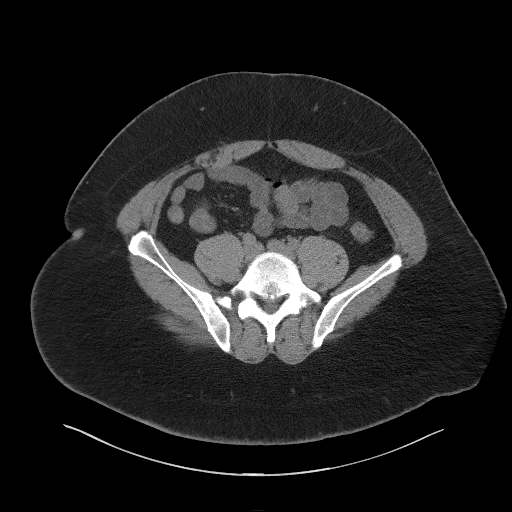
[im 56/107  soft-tissue]
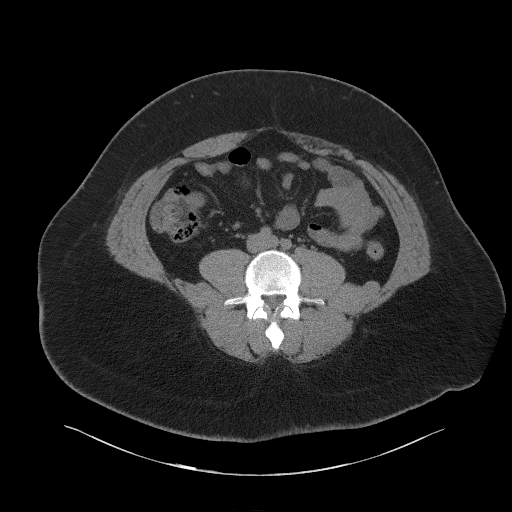
[im 60/107  soft-tissue]
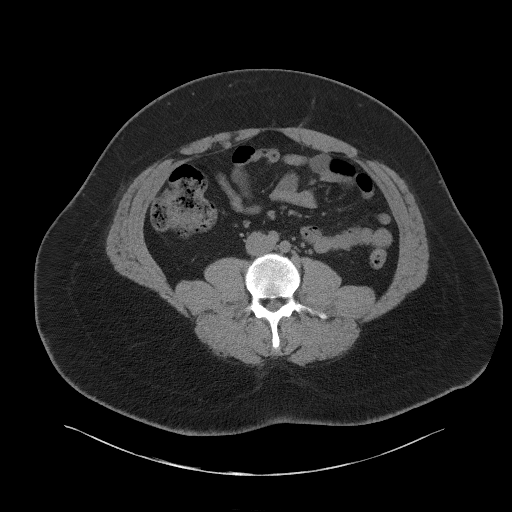
[im 68/107  soft-tissue]
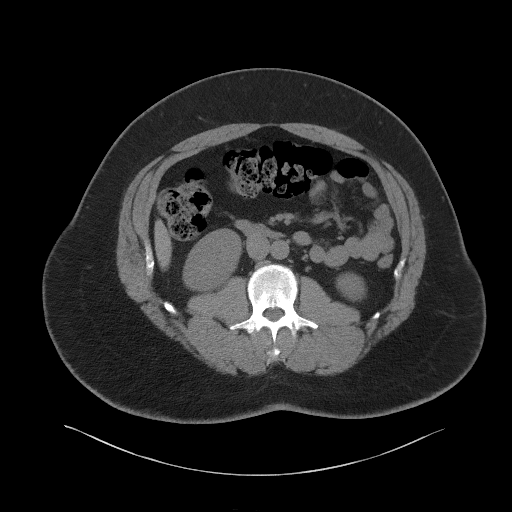
[im 68/107  bone]
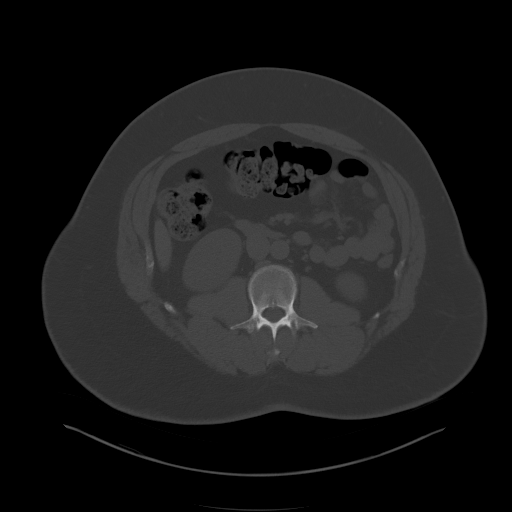
[im 77/107  soft-tissue]
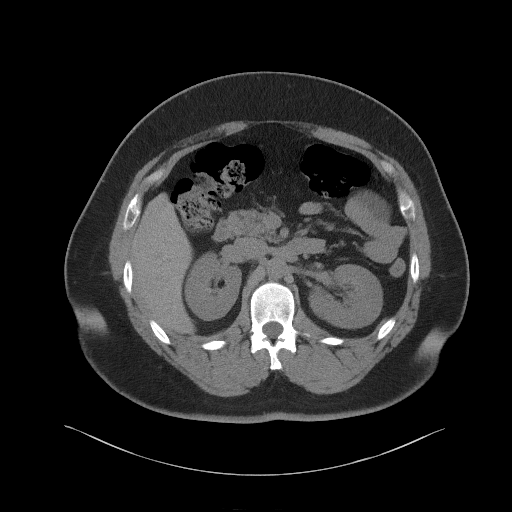
[im 85/107  soft-tissue]
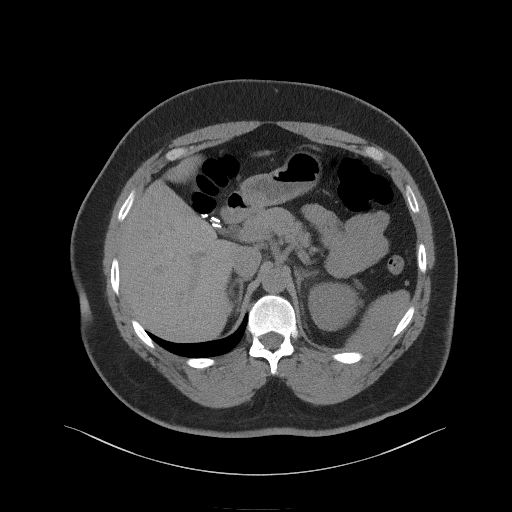
[im 94/107  soft-tissue]
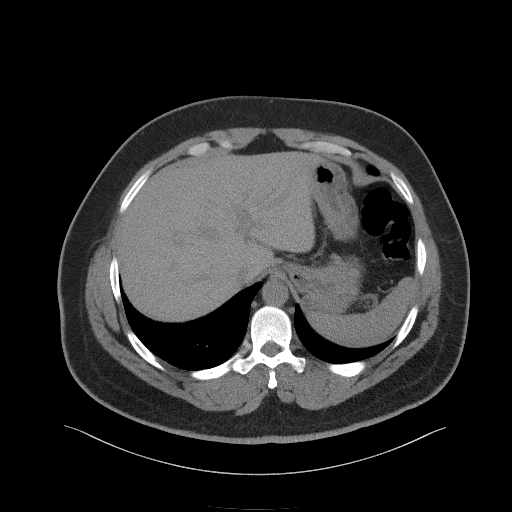
[im 102/107  soft-tissue]
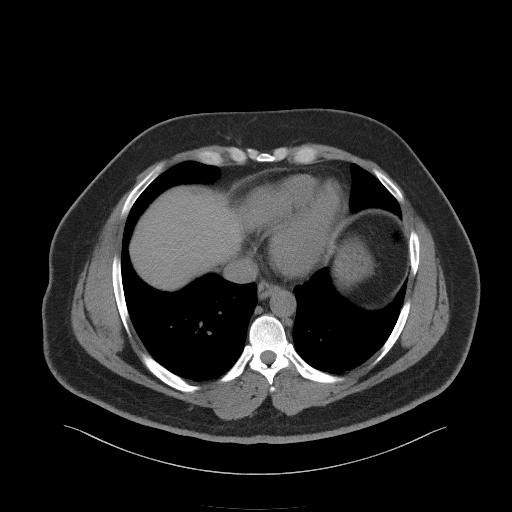

[Series 5: coronal st · coronal · 0.95mm/px · 3 of 112 slices shown]
[im 38/112  soft-tissue]
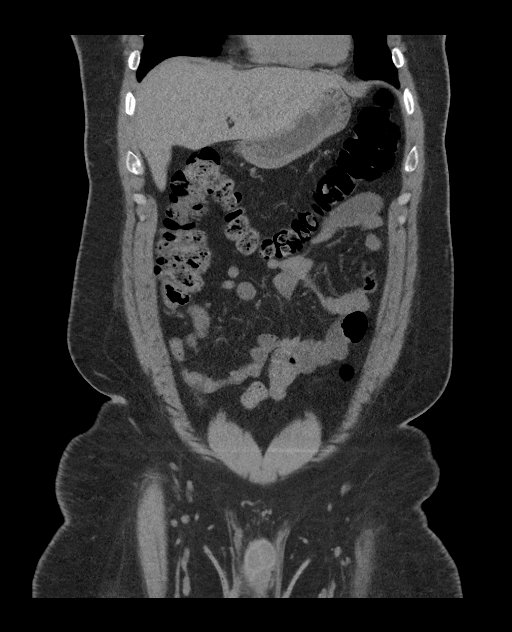
[im 50/112  soft-tissue]
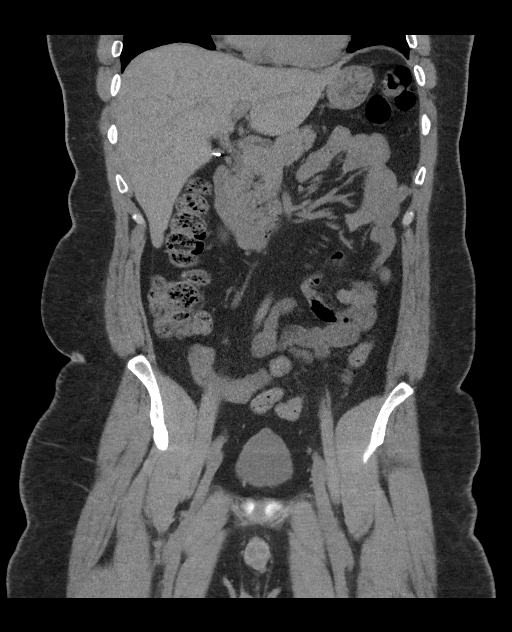
[im 62/112  soft-tissue]
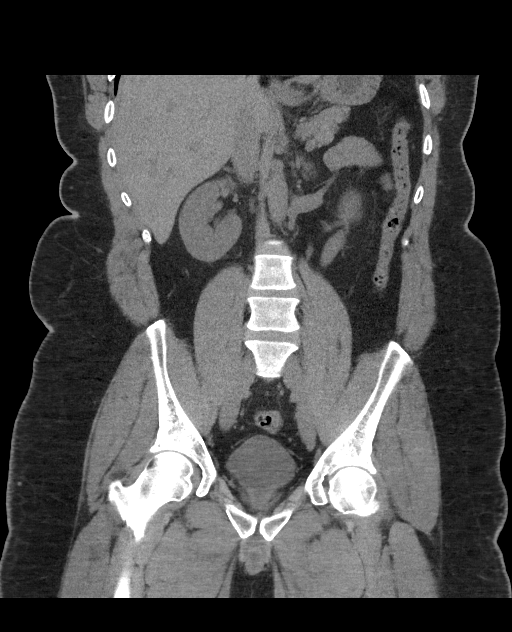

[16 of 46 positions shown; findings below may reference images not displayed]

FINDINGS: Lower chest: Lung bases are clear.  Normal heart size.

Hepatobiliary: No focal hepatic abnormality. Status post
cholecystectomy. No biliary dilatation.

Pancreas: Unremarkable. No pancreatic ductal dilatation or
surrounding inflammatory changes.

Spleen: Normal in size without focal abnormality.

Adrenals/Urinary Tract: Adrenal glands are within normal limits. No
calcified stones. No hydronephrosis. No stones along the course of
the ureters. 1 cm hypodense cyst mid left kidney. Bladder normal.

Stomach/Bowel: Stomach is within normal limits. Appendix appears
normal. No evidence of bowel wall thickening, distention, or
inflammatory changes.

Vascular/Lymphatic: No significant vascular findings are present. No
enlarged abdominal or pelvic lymph nodes.

Reproductive: Prostate is unremarkable.

Other: No abdominal wall hernia or abnormality. No abdominopelvic
ascites.

Musculoskeletal: No acute or significant osseous findings.
IMPRESSION: Negative for nephrolithiasis, hydronephrosis or ureteral stone. No
acute abnormality is visualized

## 2018-04-08 ENCOUNTER — Emergency Department (HOSPITAL_BASED_OUTPATIENT_CLINIC_OR_DEPARTMENT_OTHER)
Admission: EM | Admit: 2018-04-08 | Discharge: 2018-04-08 | Disposition: A | Discharge status: Home / Self Care | Payer: BLUE CROSS/BLUE SHIELD | Attending: Emergency Medicine | Admitting: Emergency Medicine

## 2018-04-08 ENCOUNTER — Encounter (HOSPITAL_BASED_OUTPATIENT_CLINIC_OR_DEPARTMENT_OTHER): Payer: Self-pay | Admitting: *Deleted

## 2018-04-08 ENCOUNTER — Other Ambulatory Visit: Payer: Self-pay

## 2018-04-08 DIAGNOSIS — F17210 Nicotine dependence, cigarettes, uncomplicated: Secondary | ICD-10-CM | POA: Insufficient documentation

## 2018-04-08 DIAGNOSIS — Z79899 Other long term (current) drug therapy: Secondary | ICD-10-CM | POA: Insufficient documentation

## 2018-04-08 DIAGNOSIS — I10 Essential (primary) hypertension: Secondary | ICD-10-CM | POA: Insufficient documentation

## 2018-04-08 DIAGNOSIS — H6123 Impacted cerumen, bilateral: Secondary | ICD-10-CM | POA: Insufficient documentation

## 2018-04-08 MED ORDER — OFLOXACIN 0.3 % OT SOLN: 5.0000 [drp] | Freq: Two times a day (BID) | OTIC | 0 refills | Status: DC

## 2018-04-08 MED ORDER — OFLOXACIN 0.3 % OT SOLN
5.0000 [drp] | Freq: Two times a day (BID) | OTIC | 0 refills | Status: DC
Start: 2018-04-08 — End: 2018-06-06

## 2018-04-08 NOTE — ED Notes (Signed)
Pt given Rx x 1. Ambulated to d/c window with steady gait

## 2018-04-08 NOTE — ED Triage Notes (Addendum)
Pt reports decreased hearing in left ear. Pain at night. Wax noted in canal

## 2018-04-08 NOTE — Discharge Instructions (Signed)
Discussed, you can use Debrox or hydrogen peroxide drops to help with earwax.  Use eardrops as directed.  Return to the emergency department for any Fever, worsening pain, bleeding from the or any other worsening concerning symptoms

## 2018-04-08 NOTE — ED Provider Notes (Signed)
[facility] EMERGENCY DEPARTMENT Provider Note   CSN: [id number] Arrival date & time: 04/08/18  1336     History   Chief Complaint Chief Complaint  Patient presents with  . Cerumen Impaction    HPI Derrick Holden is a 37 y.o. male and a bilateral cerumen impaction.  Patient states that he has had difficulty hearing over last few days and states that his left ear has been causing him some pain.  No fevers.  He reports attempting to clean out the Q-tip.  He has not inserted any other objects.  The history is provided by the patient.    Past Medical History:  Diagnosis Date  . Chronic back pain   . Hypertension     Patient Active Problem List   Diagnosis Date Noted  . HEMORRHOIDS, INTERNAL, WITH BLEEDING 11/17/2009  . HYPERTENSION 08/17/2009  . BACK PAIN, LUMBAR, CHRONIC 08/17/2009    Past Surgical History:  Procedure Laterality Date  . CHOLECYSTECTOMY          Home Medications    Prior to Admission medications   Medication Sig Start Date End Date Taking? Authorizing Provider  losartan-hydrochlorothiazide (HYZAAR) 100-25 MG tablet Take 1 tablet daily by mouth.   Yes [provider]  amoxicillin (AMOXIL) 500 MG capsule Take 1 capsule (500 mg total) by mouth 3 (three) times daily. 05/05/17   Lavera Guise, MD  azithromycin (ZITHROMAX) 250 MG tablet Take 1 tablet (250 mg total) by mouth daily. Take first 2 tablets together, then 1 every day until finished. 05/05/17   Lavera Guise, MD  benzonatate (TESSALON) 100 MG capsule Take 1 capsule (100 mg total) by mouth every 8 (eight) hours. 05/05/17   Lavera Guise, MD  cyclobenzaprine (FLEXERIL) 10 MG tablet Take 0.5-1 tablets (5-10 mg total) by mouth 2 (two) times daily as needed for muscle spasms. 12/03/17   Harris, Abigail, PA-C  dextromethorphan-guaiFENesin (MUCINEX DM) 30-600 MG 12hr tablet Take 1 tablet 2 (two) times daily by mouth. 04/12/17   Derwood Kaplan, MD  meloxicam (MOBIC) 15 MG tablet Take 1  tablet (15 mg total) by mouth daily. 12/03/17   Harris, Cammy Copa, PA-C  naproxen (NAPROSYN) 375 MG tablet Take 1 tablet (375 mg total) by mouth daily. 08/07/17   Felicie Morn, NP  ofloxacin (FLOXIN OTIC) 0.3 % OTIC solution Place 5 drops into the left ear 2 (two) times daily. 04/08/18   Maxwell Caul, PA-C  potassium chloride SA (K-DUR,KLOR-CON) 20 MEQ tablet Take 1 tablet (20 mEq total) by mouth 2 (two) times daily for 3 days. 10/11/17 10/14/17  Pricilla Loveless, MD  predniSONE (DELTASONE) 20 MG tablet 3 tabs po day one, then 2 tabs daily x 4 days 08/07/17   Felicie Morn, NP  traMADol (ULTRAM) 50 MG tablet Take 1 tablet (50 mg total) by mouth every 6 (six) hours as needed. 12/03/17   Arthor Captain, PA-C    Family History No family history on file.  Social History Social History   Tobacco Use  . Smoking status: Current Some Day Smoker    Types: Cigarettes  . Smokeless tobacco: Never Used  Substance Use Topics  . Alcohol use: Yes    Comment: occ  . Drug use: No     Allergies   Patient has no known allergies.   Review of Systems Review of Systems  Constitutional: Negative for fever.  HENT: Positive for hearing loss.   All other systems reviewed and are negative.    Physical Exam  Updated Vital Signs BP (!) 133/104 (BP Location: Left Arm)   Pulse 97   Temp 98.7 F (37.1 C) (Oral)   Resp 20   Ht 6\' 4"  (1.93 m)   Wt (!) 147.4 kg   SpO2 99%   BMI 39.56 kg/m   Physical Exam  Constitutional: He appears well-developed and well-nourished.  HENT:  Head: Normocephalic and atraumatic.  Right Ear: Tympanic membrane normal. Tympanic membrane is not perforated and not erythematous.  Left Ear: Tympanic membrane is not perforated and not erythematous.  Able to visualize right TM secondary to complete cerumen impaction.  Left TM partially visualized from a small hole but otherwise cerumen impaction.  Evaluation after cerumen impaction.  Right TM appears intact without any signs of  perforation.  Left TM TM appears intact without any signs of perforation.  Left external auditory canal slightly erythematous and abrasions from current removal.  Eyes: Conjunctivae and EOM are normal. Right eye exhibits no discharge. Left eye exhibits no discharge. No scleral icterus.  Pulmonary/Chest: Effort normal.  Neurological: He is alert.  Skin: Skin is warm and dry.  Psychiatric: He has a normal mood and affect. His speech is normal and behavior is normal.  Nursing note and vitals reviewed.    ED Treatments / Results  Labs (all labs ordered are listed, but only abnormal results are displayed) Labs Reviewed - No data to display  EKG None  Radiology No results found.  Procedures .Ear Cerumen Removal Date/Time: 04/08/2018 2:11 PM Performed by: Maxwell Caul, PA-C Authorized by: Maxwell Caul, PA-C   Consent:    Consent obtained:  Verbal   Consent given by:  Patient   Risks discussed:  Bleeding, infection, pain and TM perforation   Alternatives discussed:  Delayed treatment and no treatment Procedure details:    Location:  L ear   Procedure type: curette   Post-procedure details:    Inspection:  Bleeding and TM intact   Hearing quality:  Improved   Patient tolerance of procedure:  Tolerated well, no immediate complications   (including critical care time)  Medications Ordered in ED Medications - No data to display   Initial Impression / Assessment and Plan / ED Course  I have reviewed the triage vital signs and the nursing notes.  Pertinent labs & imaging results that were available during my care of the patient were reviewed by me and considered in my medical decision making (see chart for details).     37 year old male who presents for bilateral ear pain and hearing loss.  Reports that left is hurting greater than the right.  No fevers.  Reports attempting to stick a Q-tip in there to clean them out. Patient is afebrile, non-toxic appearing, sitting  comfortably on examination table. Vital signs reviewed and stable.  On exam, he has bilateral cerumen impaction.  We will plan for removal.  Cerumen removal done as documented above.  Additionally, patient's ears were flushed out.  Reevaluation shows no evidence of TM perforation.  Left ear canal was slightly irritated after cerumen removal.  We will plan to send him home on eardrops.  Encouraged at home supportive care measures. Patient had ample opportunity for questions and discussion. All patient's questions were answered with full understanding. Strict return precautions discussed. Patient expresses understanding and agreement to plan.   Final Clinical Impressions(s) / ED Diagnoses   Final diagnoses:  Bilateral impacted cerumen    ED Discharge Orders         Ordered  ofloxacin (FLOXIN OTIC) 0.3 % OTIC solution  2 times daily,   Status:  Discontinued     04/08/18 1420    ofloxacin (FLOXIN OTIC) 0.3 % OTIC solution  2 times daily     04/08/18 1420           Maxwell Caul, PA-C 04/08/18 1456    Derwood Kaplan, MD 04/09/18 [phone]

## 2018-06-06 ENCOUNTER — Emergency Department (HOSPITAL_BASED_OUTPATIENT_CLINIC_OR_DEPARTMENT_OTHER)
Admission: EM | Admit: 2018-06-06 | Discharge: 2018-06-06 | Disposition: A | Discharge status: Home / Self Care | Payer: 59 | Attending: Emergency Medicine | Admitting: Emergency Medicine

## 2018-06-06 ENCOUNTER — Encounter (HOSPITAL_BASED_OUTPATIENT_CLINIC_OR_DEPARTMENT_OTHER): Payer: Self-pay

## 2018-06-06 ENCOUNTER — Other Ambulatory Visit: Payer: Self-pay

## 2018-06-06 ENCOUNTER — Emergency Department (HOSPITAL_BASED_OUTPATIENT_CLINIC_OR_DEPARTMENT_OTHER): Payer: 59

## 2018-06-06 DIAGNOSIS — Z79899 Other long term (current) drug therapy: Secondary | ICD-10-CM | POA: Diagnosis not present

## 2018-06-06 DIAGNOSIS — F17210 Nicotine dependence, cigarettes, uncomplicated: Secondary | ICD-10-CM | POA: Insufficient documentation

## 2018-06-06 DIAGNOSIS — R053 Chronic cough: Secondary | ICD-10-CM

## 2018-06-06 DIAGNOSIS — I10 Essential (primary) hypertension: Secondary | ICD-10-CM | POA: Insufficient documentation

## 2018-06-06 DIAGNOSIS — R05 Cough: Secondary | ICD-10-CM | POA: Diagnosis not present

## 2018-06-06 MED ORDER — ALBUTEROL SULFATE HFA 108 (90 BASE) MCG/ACT IN AERS
2.0000 | INHALATION_SPRAY | RESPIRATORY_TRACT | Status: DC | PRN
  Filled 2018-06-06: qty 6.7

## 2018-06-06 MED ORDER — HYDROCOD POLST-CPM POLST ER 10-8 MG/5ML PO SUER: 5.0000 mL | Freq: Two times a day (BID) | ORAL | 0 refills | Status: AC | PRN

## 2018-06-06 NOTE — Progress Notes (Signed)
RT provided Albuterol inhaler and spacer to patient. RT educated patient on use and how often to use it. Patient verbalized that he understood.

## 2018-06-06 NOTE — ED Provider Notes (Signed)
[facility] Provider Note: Lowella DellJ. Lane Jacie Tristan, MD, FACEP  CSN: [id number] MRN: [id number] ARRIVAL: 06/06/18 at 2140 ROOM: [facility]07/[facility]07   CHIEF COMPLAINT  Cough   HISTORY OF PRESENT ILLNESS  06/06/18 10:57 PM Derrick Holden is a 38 y.o. male with a 2-week history of a persistent cough.  The cough is worse at night and has been keeping him awake.  He is also felt fatigued over the same time.  He had nasal congestion early in the course of his illness but that has resolved.  He did have a sore throat yesterday but none currently.  He denies fever.   Past Medical History:  Diagnosis Date  . Chronic back pain   . Hypertension     Past Surgical History:  Procedure Laterality Date  . CHOLECYSTECTOMY      No family history on file.  Social History   Tobacco Use  . Smoking status: Current Some Day Smoker    Types: Cigarettes  . Smokeless tobacco: Never Used  Substance Use Topics  . Alcohol use: Yes    Comment: occ  . Drug use: No    Prior to Admission medications   Medication Sig Start Date End Date Taking? Authorizing Provider  losartan-hydrochlorothiazide (HYZAAR) 100-25 MG tablet Take 1 tablet daily by mouth.   Yes [provider]  amoxicillin (AMOXIL) 500 MG capsule Take 1 capsule (500 mg total) by mouth 3 (three) times daily. 05/05/17   Lavera GuiseLiu, Dana Duo, MD  azithromycin (ZITHROMAX) 250 MG tablet Take 1 tablet (250 mg total) by mouth daily. Take first 2 tablets together, then 1 every day until finished. 05/05/17   Lavera GuiseLiu, Dana Duo, MD  benzonatate (TESSALON) 100 MG capsule Take 1 capsule (100 mg total) by mouth every 8 (eight) hours. 05/05/17   Lavera GuiseLiu, Dana Duo, MD  cyclobenzaprine (FLEXERIL) 10 MG tablet Take 0.5-1 tablets (5-10 mg total) by mouth 2 (two) times daily as needed for muscle spasms. 12/03/17   Harris, Abigail, PA-C  dextromethorphan-guaiFENesin (MUCINEX DM) 30-600 MG 12hr tablet Take 1 tablet 2 (two) times daily by mouth. 04/12/17   Derwood KaplanNanavati, Ankit, MD    meloxicam (MOBIC) 15 MG tablet Take 1 tablet (15 mg total) by mouth daily. 12/03/17   Harris, Cammy CopaAbigail, PA-C  naproxen (NAPROSYN) 375 MG tablet Take 1 tablet (375 mg total) by mouth daily. 08/07/17   Felicie MornSmith, David, NP  ofloxacin (FLOXIN OTIC) 0.3 % OTIC solution Place 5 drops into the left ear 2 (two) times daily. 04/08/18   Maxwell CaulLayden, Lindsey A, PA-C  potassium chloride SA (K-DUR,KLOR-CON) 20 MEQ tablet Take 1 tablet (20 mEq total) by mouth 2 (two) times daily for 3 days. 10/11/17 10/14/17  Pricilla LovelessGoldston, Scott, MD  predniSONE (DELTASONE) 20 MG tablet 3 tabs po day one, then 2 tabs daily x 4 days 08/07/17   Felicie MornSmith, David, NP  traMADol (ULTRAM) 50 MG tablet Take 1 tablet (50 mg total) by mouth every 6 (six) hours as needed. 12/03/17   Arthor CaptainHarris, Abigail, PA-C    Allergies Patient has no known allergies.   REVIEW OF SYSTEMS  Negative except as noted here or in the History of Present Illness.   PHYSICAL EXAMINATION  Initial Vital Signs Blood pressure 120/83, pulse 88, temperature 98.3 F (36.8 C), temperature source Oral, resp. rate 20, height 6\' 4"  (1.93 m), weight (!) 147.4 kg, SpO2 98 %.  Examination General: Well-developed, well-nourished male in no acute distress; appearance consistent with age of record HENT: normocephalic; atraumatic; no pharyngeal erythema or exudate  Eyes: pupils equal, round and reactive to light; extraocular muscles intact Neck: supple Heart: regular rate and rhythm Lungs: clear to auscultation bilaterally Abdomen: soft; nondistended; nontender;  bowel sounds present Extremities: No deformity; full range of motion; pulses normal Neurologic: Awake, alert and oriented; motor function intact in all extremities and symmetric; no facial droop Skin: Warm and dry Psychiatric: Normal mood and affect   RESULTS  Summary of this visit's results, reviewed by myself:   EKG Interpretation  Date/Time:    Ventricular Rate:    PR Interval:    QRS Duration:   QT Interval:    QTC  Calculation:   R Axis:     Text Interpretation:        Laboratory Studies: No results found for this or any previous visit (from the past 24 hour(s)). Imaging Studies: Dg Chest 2 View  Result Date: 06/06/2018 CLINICAL DATA:  Cough for 2 weeks EXAM: CHEST - 2 VIEW COMPARISON:  10/11/2017 FINDINGS: The heart size and mediastinal contours are within normal limits. Both lungs are clear. The visualized skeletal structures are unremarkable. IMPRESSION: No active cardiopulmonary disease. Electronically Signed   By: Jasmine Pang M.D.   On: 06/06/2018 23:04    ED COURSE and MDM  Nursing notes and initial vitals signs, including pulse oximetry, reviewed.  Vitals:   06/06/18 2145 06/06/18 2146  BP:  120/83  Pulse:  88  Resp:  20  Temp:  98.3 F (36.8 C)  TempSrc:  Oral  SpO2:  98%  Weight: (!) 147.4 kg   Height: 6\' 4"  (1.93 m)    We will provide an albuterol inhaler and instruct him in its use.  I do not believe antibiotics are indicated at this time.  PROCEDURES    ED DIAGNOSES     ICD-10-CM   1. Persistent cough R05        Miaya Lafontant, MD 06/06/18 2325

## 2018-06-06 NOTE — ED Triage Notes (Signed)
C/o flu like sx x 2 weeks-NAD-steady gait 

## 2024-05-31 ENCOUNTER — Encounter (HOSPITAL_BASED_OUTPATIENT_CLINIC_OR_DEPARTMENT_OTHER): Payer: Self-pay

## 2024-05-31 ENCOUNTER — Emergency Department (HOSPITAL_BASED_OUTPATIENT_CLINIC_OR_DEPARTMENT_OTHER)

## 2024-05-31 ENCOUNTER — Emergency Department (HOSPITAL_BASED_OUTPATIENT_CLINIC_OR_DEPARTMENT_OTHER)
Admission: EM | Admit: 2024-05-31 | Discharge: 2024-05-31 | Disposition: A | Discharge status: Home / Self Care | Attending: Emergency Medicine | Admitting: Emergency Medicine

## 2024-05-31 ENCOUNTER — Other Ambulatory Visit: Payer: Self-pay

## 2024-05-31 DIAGNOSIS — J101 Influenza due to other identified influenza virus with other respiratory manifestations: Secondary | ICD-10-CM | POA: Insufficient documentation

## 2024-05-31 DIAGNOSIS — J111 Influenza due to unidentified influenza virus with other respiratory manifestations: Secondary | ICD-10-CM

## 2024-05-31 DIAGNOSIS — R059 Cough, unspecified: Secondary | ICD-10-CM | POA: Diagnosis present

## 2024-05-31 LAB — RESP PANEL BY RT-PCR (RSV, FLU A&B, COVID)  RVPGX2
Influenza A by PCR: POSITIVE — AB
Influenza B by PCR: NEGATIVE
Resp Syncytial Virus by PCR: NEGATIVE
SARS Coronavirus 2 by RT PCR: NEGATIVE

## 2024-05-31 MED ORDER — ACETAMINOPHEN 500 MG PO TABS
1000.0000 mg | ORAL_TABLET | Freq: Once | ORAL | Status: AC
  Administered 2024-05-31: 1000 mg via ORAL
  Filled 2024-05-31: qty 2

## 2024-05-31 MED ORDER — OSELTAMIVIR PHOSPHATE 75 MG PO CAPS: 75.0000 mg | ORAL_CAPSULE | Freq: Two times a day (BID) | ORAL | 0 refills | Status: AC

## 2024-05-31 NOTE — ED Triage Notes (Addendum)
 Patient reports generalized body aches, cough, fever that started today with sinus pressure. Ambulates with steady gait, NAD noted.

## 2024-05-31 NOTE — ED Provider Notes (Signed)
 "  EMERGENCY DEPARTMENT AT [facility] Provider Note   CSN: [id number] Arrival date & time: 05/31/24  1820     Patient presents with: Facial Pain, Cough, and Generalized Body Aches   Derrick Holden is a 43 y.o. male.   Patient that started this morning of body aches, nonproductive cough, fever and frontal headache. No nausea or vomiting. No sick contacts. No chest pain or SoB.   The history is provided by the patient. No language interpreter was used.  Cough      Prior to Admission medications  Medication Sig Start Date End Date Taking? Authorizing Provider  oseltamivir  (TAMIFLU ) 75 MG capsule Take 1 capsule (75 mg total) by mouth every 12 (twelve) hours. 05/31/24  Yes Odell Balls, PA-C  chlorpheniramine-HYDROcodone  (TUSSIONEX PENNKINETIC ER) 10-8 MG/5ML SUER Take 5 mLs by mouth every 12 (twelve) hours as needed. 06/06/18   Molpus, John, MD  losartan-hydrochlorothiazide (HYZAAR) 100-25 MG tablet Take 1 tablet daily by mouth.    [provider]    Allergies: Lisinopril    Review of Systems  Respiratory:  Positive for cough.     Updated Vital Signs BP (!) 133/90   Pulse (!) 107   Temp (!) 100.4 F (38 C)   Resp 20   Ht 6' 4 (1.93 m)   Wt (!) 142.9 kg   SpO2 98%   BMI 38.34 kg/m   Physical Exam Vitals and nursing note reviewed.  Constitutional:      Appearance: Normal appearance. He is well-developed. He is not toxic-appearing.  HENT:     Head: Normocephalic.     Nose: Nose normal.     Mouth/Throat:     Mouth: Mucous membranes are moist.     Pharynx: No oropharyngeal exudate.  Cardiovascular:     Rate and Rhythm: Normal rate and regular rhythm.     Heart sounds: No murmur heard. Pulmonary:     Effort: Pulmonary effort is normal.     Breath sounds: Normal breath sounds. No wheezing, rhonchi or rales.  Abdominal:     General: Bowel sounds are normal.     Palpations: Abdomen is soft.     Tenderness: There is no abdominal  tenderness. There is no guarding or rebound.  Musculoskeletal:        General: Normal range of motion.     Cervical back: Normal range of motion and neck supple.  Skin:    General: Skin is warm and dry.  Neurological:     General: No focal deficit present.     Mental Status: He is alert and oriented to person, place, and time.     (all labs ordered are listed, but only abnormal results are displayed) Labs Reviewed  RESP PANEL BY RT-PCR (RSV, FLU A&B, COVID)  [id number] - Abnormal; Notable for the following components:      Result Value   Influenza A by PCR POSITIVE (*)    All other components within normal limits    EKG: None  Radiology: DG Chest 2 View Result Date: 05/31/2024 EXAM: PA AND LATERAL (2) VIEW(S) XRAY OF THE CHEST 05/31/2024 07:13:00 PM COMPARISON: PA and lateral chest 06/06/2018, portable chest 09/03/2023. CLINICAL HISTORY: cough and low-grade fever with body aches and facial pain. FINDINGS: LUNGS AND PLEURA: No focal pulmonary opacity. No pleural effusion. No pneumothorax. HEART AND MEDIASTINUM: No acute abnormality of the cardiac and mediastinal silhouettes. BONES AND SOFT TISSUES: No acute osseous abnormality. IMPRESSION: 1. No acute process. Electronically signed by:  Francis Quam MD 05/31/2024 09:14 PM EST RP Workstation: [id number]     Procedures   Medications Ordered in the ED  acetaminophen  (TYLENOL ) tablet 1,000 mg (1,000 mg Oral Given 05/31/24 2220)    Clinical Course as of 05/31/24 2256  Fri May 31, 2024  2255 Patient with onset of febrile URI symptoms this morning. Nontoxic in appearance. Viral panel +influenza A. Discussed Tamiflu  and patient reports he would prefer to have it prescribed. Discussed supportive care and return precautions.  [SU]    Clinical Course User Index [SU] Odell Balls, PA-C                                 Medical Decision Making Amount and/or Complexity of Data Reviewed Radiology: ordered.  Risk OTC drugs.         Final diagnoses:  Influenza    ED Discharge Orders          Ordered    oseltamivir  (TAMIFLU ) 75 MG capsule  Every 12 hours        05/31/24 2219               Odell Balls, PA-C 05/31/24 2256    Dreama Longs, MD 06/01/24 1203  "

## 2024-05-31 NOTE — Discharge Instructions (Signed)
 As we discussed, it is important to drink lots of fluids to avoid dehydration. Tylenol  and/or ibuprofen  for aches and any fever. You have chosen to take Tamiflu , which is twice daily for the next 5 days.   Return to the ED with any new or concerning symptoms at any time.
